# Patient Record
Sex: Male | Born: 1965 | Race: Black or African American | Hispanic: No | Marital: Married | Smoking: Never smoker
Health system: Southern US, Community
[De-identification: ages and names within clinical notes are randomized; demographics above are authoritative.]

## PROBLEM LIST (undated history)

## (undated) DIAGNOSIS — E7800 Pure hypercholesterolemia, unspecified: Secondary | ICD-10-CM

## (undated) DIAGNOSIS — I10 Essential (primary) hypertension: Secondary | ICD-10-CM

---

## 2015-07-08 ENCOUNTER — Encounter (HOSPITAL_COMMUNITY): Payer: Self-pay | Admitting: *Deleted

## 2015-07-08 ENCOUNTER — Emergency Department (HOSPITAL_COMMUNITY): Payer: Federal, State, Local not specified - PPO

## 2015-07-08 ENCOUNTER — Emergency Department (HOSPITAL_COMMUNITY)
Admission: EM | Admit: 2015-07-08 | Discharge: 2015-07-08 | Disposition: A | Discharge status: Home / Self Care | Payer: Federal, State, Local not specified - PPO | Attending: Emergency Medicine | Admitting: Emergency Medicine

## 2015-07-08 DIAGNOSIS — I10 Essential (primary) hypertension: Secondary | ICD-10-CM | POA: Insufficient documentation

## 2015-07-08 DIAGNOSIS — R0781 Pleurodynia: Secondary | ICD-10-CM

## 2015-07-08 DIAGNOSIS — Z79899 Other long term (current) drug therapy: Secondary | ICD-10-CM | POA: Insufficient documentation

## 2015-07-08 DIAGNOSIS — R071 Chest pain on breathing: Secondary | ICD-10-CM | POA: Diagnosis present

## 2015-07-08 HISTORY — DX: Pure hypercholesterolemia, unspecified: E78.00

## 2015-07-08 HISTORY — DX: Essential (primary) hypertension: I10

## 2015-07-08 LAB — BASIC METABOLIC PANEL
ANION GAP: 7 (ref 5–15)
BUN: 23 mg/dL — ABNORMAL HIGH (ref 6–20)
CO2: 28 mmol/L (ref 22–32)
Calcium: 9.4 mg/dL (ref 8.9–10.3)
Chloride: 104 mmol/L (ref 101–111)
Creatinine, Ser: 1.28 mg/dL — ABNORMAL HIGH (ref 0.61–1.24)
GFR calc Af Amer: >60 mL/min (ref >60)
Glucose, Bld: 118 mg/dL — ABNORMAL HIGH (ref 65–99)
POTASSIUM: 3.5 mmol/L (ref 3.5–5.1)
SODIUM: 139 mmol/L (ref 135–145)

## 2015-07-08 LAB — CBC
HEMATOCRIT: 41.7 % (ref 39.0–52.0)
HEMOGLOBIN: 14.2 g/dL (ref 13.0–17.0)
MCH: 28.9 pg (ref 26.0–34.0)
MCHC: 34.1 g/dL (ref 30.0–36.0)
MCV: 84.8 fL (ref 78.0–100.0)
Platelets: 188 10*3/uL (ref 150–400)
RBC: 4.92 MIL/uL (ref 4.22–5.81)
RDW: 13.8 % (ref 11.5–15.5)
WBC: 7.9 10*3/uL (ref 4.0–10.5)

## 2015-07-08 LAB — I-STAT TROPONIN, ED: Troponin i, poc: 0 ng/mL (ref 0.00–0.08)

## 2015-07-08 LAB — D-DIMER, QUANTITATIVE (NOT AT ARMC)

## 2015-07-08 NOTE — ED Notes (Signed)
Pt states that he has had some right sided chest pain x 1 week; pt states that it hurts more when he coughs and states that the pain is relieved when he belches; pt states that the pain radiates to his back; pt states that he has intermittent shortness of breath but denies currently; pt denies N/V; pt denies diaphoresis

## 2015-07-08 NOTE — ED Provider Notes (Addendum)
CSN: [id number]     Arrival date & time 07/08/15  0316 History   First MD Initiated Contact with Patient 07/08/15 0400     Chief Complaint  Patient presents with  . Chest Pain     (Consider location/radiation/quality/duration/timing/severity/associated sxs/prior Treatment) HPI this is a 50 year old male with a history of hypertension on lisinopril but poorly compliant. He is here with a one-week history of chest pain. The chest pain is located in his right upper chest. He describes it as sharp and pleuritic. It was more severe several days ago but is now improving and he describes it now as a "soreness". The pain is worse with deep breathing or cough. He had some shortness of breath earlier in the week which was not severe and has resolved. He denies diaphoresis, nausea, vomiting, diarrhea, fever or chills. He denies leg pain or swelling.  Past Medical History  Diagnosis Date  . Hypertension   . Hypercholesterolemia    History reviewed. No pertinent past surgical history. No family history on file. Social History  Substance Use Topics  . Smoking status: Never Smoker   . Smokeless tobacco: None  . Alcohol Use: No    Review of Systems  All other systems reviewed and are negative.   Allergies  Review of patient's allergies indicates no known allergies.  Home Medications   Prior to Admission medications   Medication Sig Start Date End Date Taking? Authorizing Provider  lisinopril-hydrochlorothiazide (PRINZIDE,ZESTORETIC) 20-25 MG tablet Take 1 tablet by mouth daily. 05/19/15  Yes Historical Provider, MD   BP 164/110 mmHg  Pulse 79  Temp(Src) 98.2 F (36.8 C) (Oral)  Resp 16  Ht 5\' 9"  (1.753 m)  Wt 205 lb (92.987 kg)  BMI 30.26 kg/m2  SpO2 100%   Physical Exam  General: Well-developed, well-nourished male in no acute distress; appearance consistent with age of record HENT: normocephalic; atraumatic Eyes: pupils equal, round and reactive to light; extraocular muscles  intact Neck: supple Heart: regular rate and rhythm; no murmurs, rubs or gallops Lungs: clear to auscultation bilaterally Chest: Nontender Abdomen: soft; nondistended; nontender; no masses or hepatosplenomegaly; bowel sounds present Extremities: No deformity; full range of motion; pulses normal; no edema Neurologic: Awake, alert and oriented; motor function intact in all extremities and symmetric; no facial droop Skin: Warm and dry Psychiatric: Normal mood and affect    ED Course  Procedures (including critical care time)   EKG Interpretation   Date/Time:  Thursday July 08 2015 03:30:43 EDT Ventricular Rate:  69 PR Interval:    QRS Duration: 102 QT Interval:  406 QTC Calculation: 435 R Axis:   36 Text Interpretation:  Sinus rhythm Normal ECG No previous ECGs available  Confirmed by Krystelle Prashad  MD, Jonny RuizJOHN ([id number]) on 07/08/2015 3:41:13 AM      MDM  Nursing notes and vitals signs, including pulse oximetry, reviewed.  Summary of this visit's results, reviewed by myself:  Labs:  Results for orders placed or performed during the hospital encounter of 07/08/15 (from the past 24 hour(s))  Basic metabolic panel     Status: Abnormal   Collection Time: 07/08/15  4:26 AM  Result Value Ref Range   Sodium 139 135 - 145 mmol/L   Potassium 3.5 3.5 - 5.1 mmol/L   Chloride 104 101 - 111 mmol/L   CO2 28 22 - 32 mmol/L   Glucose, Bld 118 (H) 65 - 99 mg/dL   BUN 23 (H) 6 - 20 mg/dL   Creatinine, Ser 1.911.28 (H) 0.61 -  1.24 mg/dL   Calcium 9.4 8.9 - 16.110.3 mg/dL   GFR calc non Af Amer >60 >60 mL/min   GFR calc Af Amer >60 >60 mL/min   Anion gap 7 5 - 15  CBC     Status: None   Collection Time: 07/08/15  4:26 AM  Result Value Ref Range   WBC 7.9 4.0 - 10.5 K/uL   RBC 4.92 4.22 - 5.81 MIL/uL   Hemoglobin 14.2 13.0 - 17.0 g/dL   HCT 09.641.7 04.539.0 - 40.952.0 %   MCV 84.8 78.0 - 100.0 fL   MCH 28.9 26.0 - 34.0 pg   MCHC 34.1 30.0 - 36.0 g/dL   RDW 81.113.8 91.411.5 - 78.215.5 %   Platelets 188 150 - 400 K/uL   D-dimer, quantitative (not at [facility])     Status: None   Collection Time: 07/08/15  4:27 AM  Result Value Ref Range   D-Dimer, Quant <0.27 0.00 - 0.50 ug/mL-FEU  I-stat troponin, ED     Status: None   Collection Time: 07/08/15  4:37 AM  Result Value Ref Range   Troponin i, poc 0.00 0.00 - 0.08 ng/mL   Comment 3            Imaging Studies: Dg Chest 2 View  07/08/2015  CLINICAL DATA:  Midchest pain for 1 week.  Intermittent dyspnea. EXAM: CHEST  2 VIEW COMPARISON:  None. FINDINGS: The heart size and mediastinal contours are within normal limits. Both lungs are clear. The visualized skeletal structures are unremarkable. IMPRESSION: No active cardiopulmonary disease. Electronically Signed   By: Ellery Plunkaniel R Mitchell M.D.   On: 07/08/2015 04:06   5:05 AM The patient was advised of reassuring laboratory and other studies. He was advised to be more compliant with his lisinopril as hypertension can cause serious long-term problems.    Paula LibraJohn Jaryn Hocutt, MD 07/08/15 [id number]  Paula LibraJohn Bostyn Bogie, MD 07/08/15 [id number]

## 2016-09-08 LAB — BASIC METABOLIC PANEL
BUN: 18 (ref 4–21)
CREATININE: 1.2 (ref 0.6–1.3)
Glucose: 131

## 2016-09-08 LAB — HEPATIC FUNCTION PANEL: Bilirubin, Total: 1

## 2017-10-26 IMAGING — CR DG CHEST 2V
2 series · 2 of 2 positions shown · non-contrast
Comparison: None.

CLINICAL DATA: Midchest pain for 1 week.  Intermittent dyspnea.

EXAM:
CHEST  2 VIEW

[w chest pa]
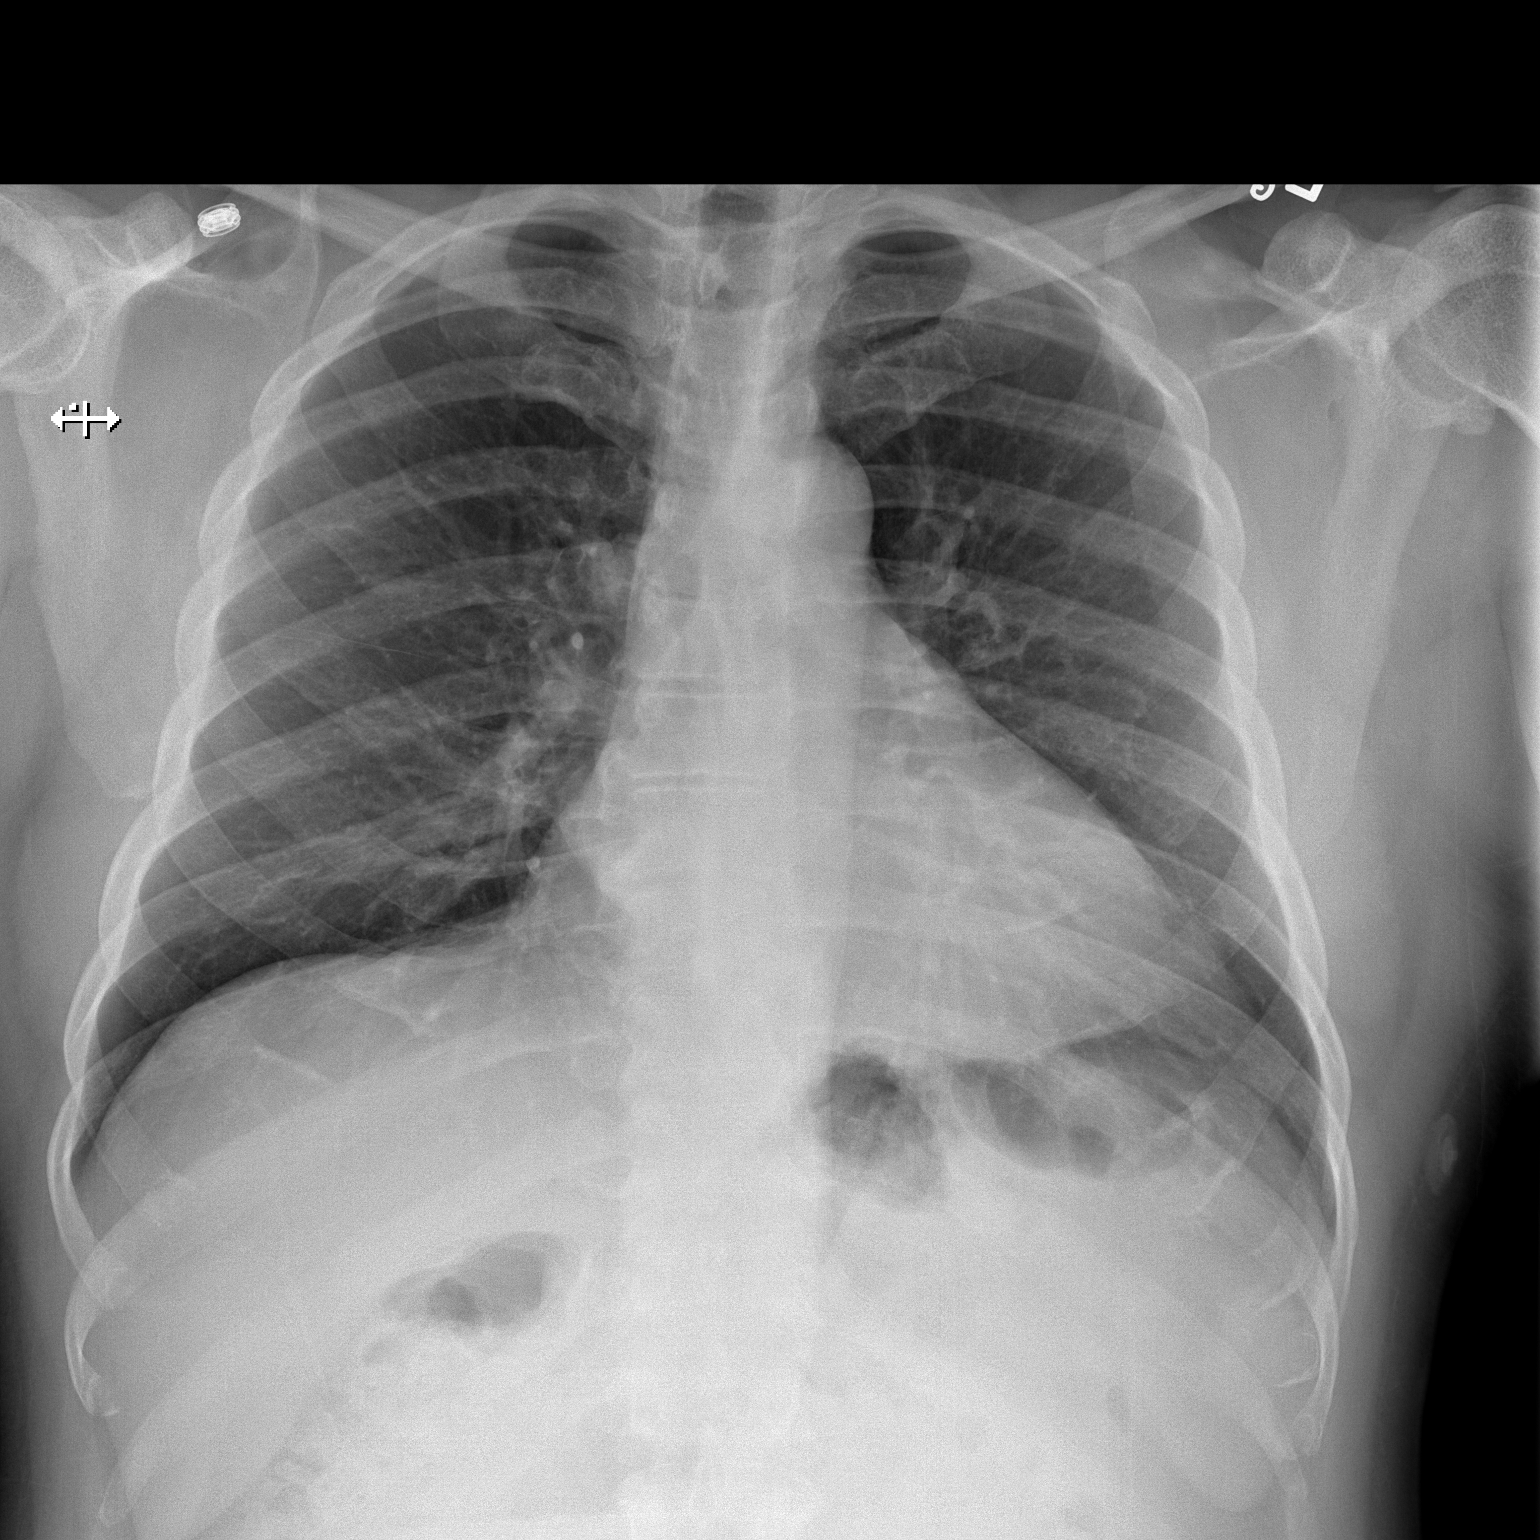

[w chest lat]
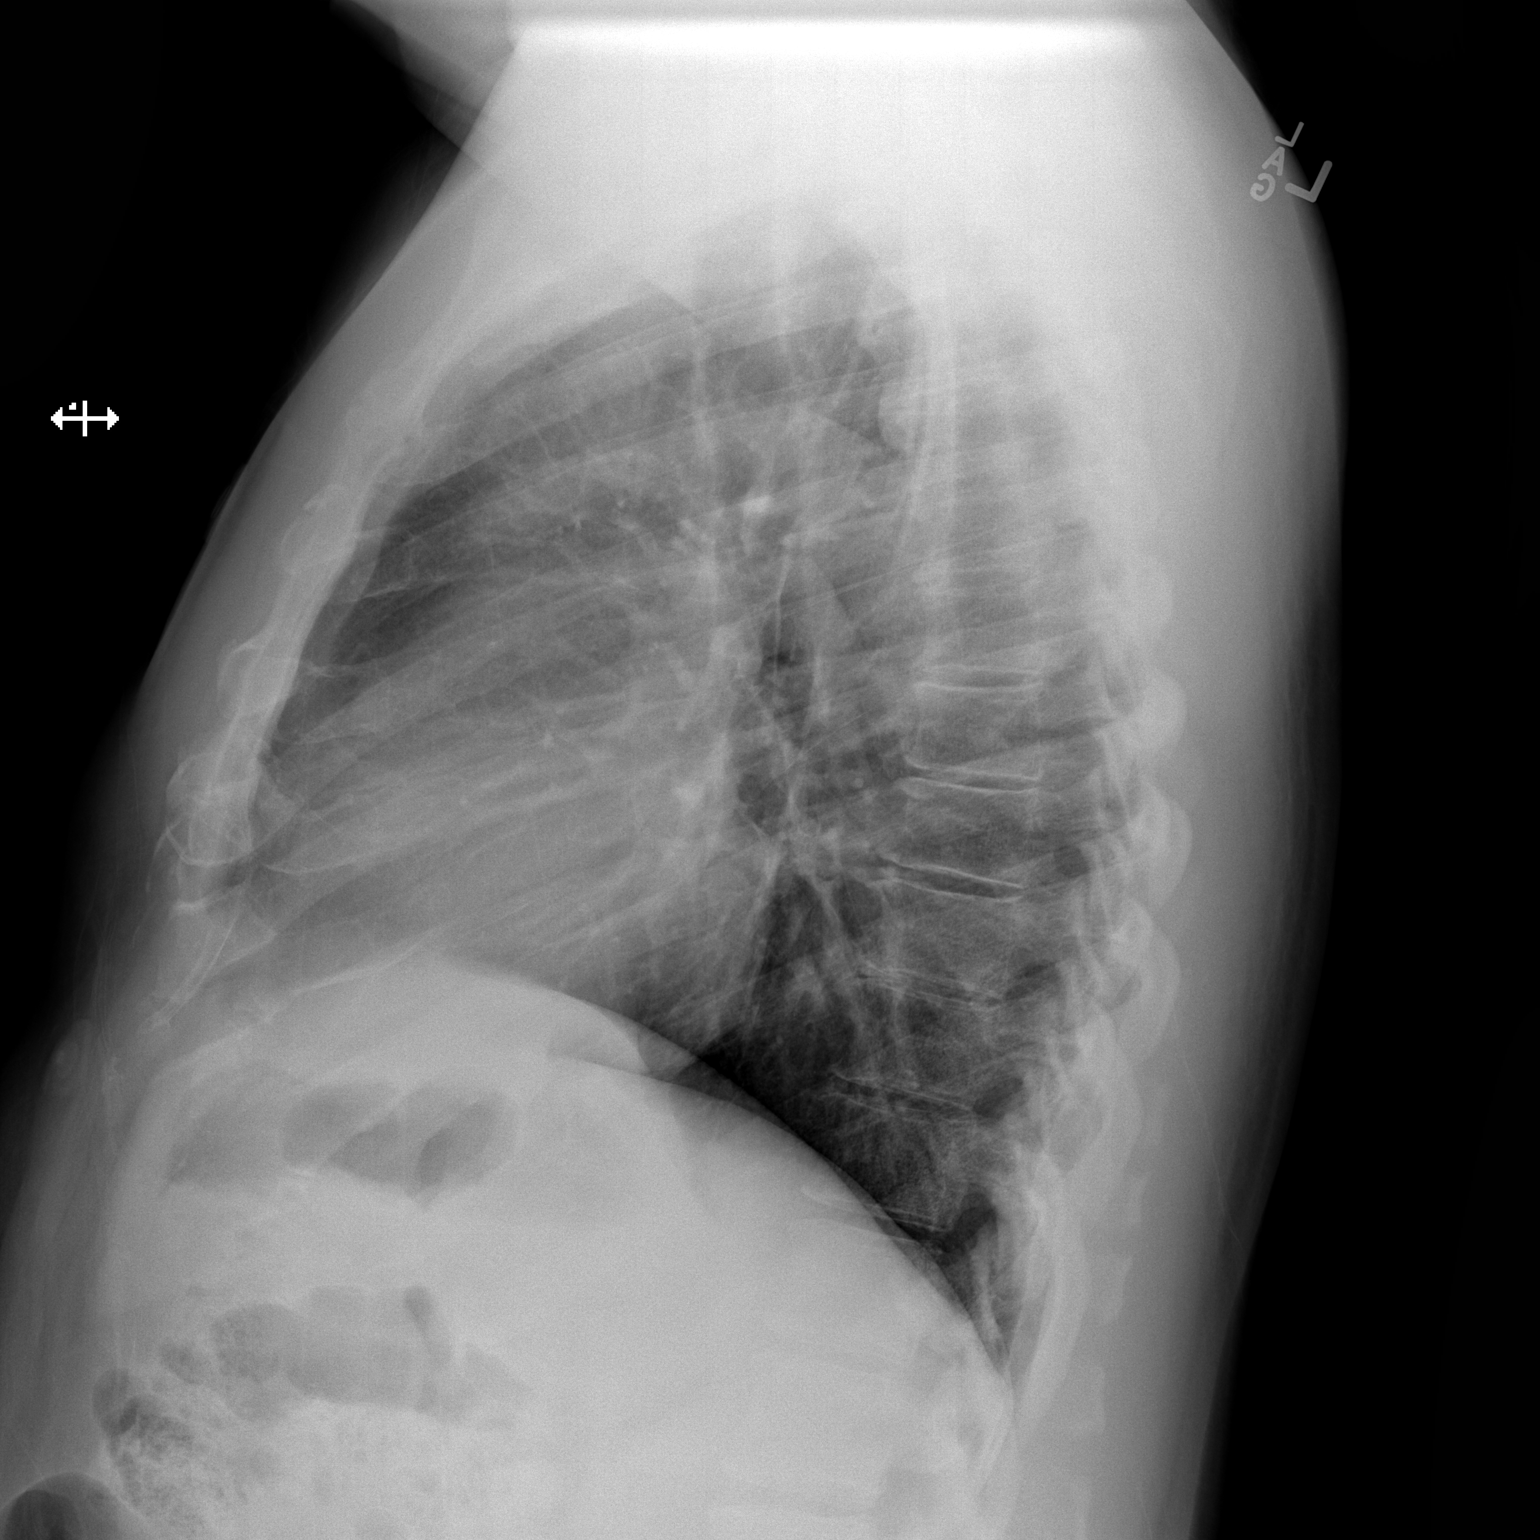

[2 of 2 positions shown; findings below may reference images not displayed]

FINDINGS: The heart size and mediastinal contours are within normal limits.
Both lungs are clear. The visualized skeletal structures are
unremarkable.
IMPRESSION: No active cardiopulmonary disease.

## 2018-03-19 ENCOUNTER — Encounter: Payer: Self-pay | Admitting: Family Medicine

## 2018-03-19 ENCOUNTER — Ambulatory Visit: Payer: Federal, State, Local not specified - PPO | Admitting: Family Medicine

## 2018-03-19 VITALS — BP 134/78 | HR 77 | Temp 98.1°F | Ht 69.0 in | Wt 212.6 lb

## 2018-03-19 DIAGNOSIS — I10 Essential (primary) hypertension: Secondary | ICD-10-CM

## 2018-03-19 DIAGNOSIS — I152 Hypertension secondary to endocrine disorders: Secondary | ICD-10-CM | POA: Insufficient documentation

## 2018-03-19 DIAGNOSIS — Z1211 Encounter for screening for malignant neoplasm of colon: Secondary | ICD-10-CM

## 2018-03-19 DIAGNOSIS — R739 Hyperglycemia, unspecified: Secondary | ICD-10-CM | POA: Diagnosis not present

## 2018-03-19 DIAGNOSIS — E785 Hyperlipidemia, unspecified: Secondary | ICD-10-CM

## 2018-03-19 DIAGNOSIS — Z0001 Encounter for general adult medical examination with abnormal findings: Secondary | ICD-10-CM

## 2018-03-19 DIAGNOSIS — E1159 Type 2 diabetes mellitus with other circulatory complications: Secondary | ICD-10-CM | POA: Insufficient documentation

## 2018-03-19 DIAGNOSIS — E1169 Type 2 diabetes mellitus with other specified complication: Secondary | ICD-10-CM | POA: Insufficient documentation

## 2018-03-19 DIAGNOSIS — E1165 Type 2 diabetes mellitus with hyperglycemia: Secondary | ICD-10-CM | POA: Insufficient documentation

## 2018-03-19 DIAGNOSIS — Z6831 Body mass index (BMI) 31.0-31.9, adult: Secondary | ICD-10-CM

## 2018-03-19 LAB — COMPREHENSIVE METABOLIC PANEL
ALT: 21 U/L (ref 0–53)
AST: 22 U/L (ref 0–37)
Albumin: 4.7 g/dL (ref 3.5–5.2)
Alkaline Phosphatase: 65 U/L (ref 39–117)
BUN: 18 mg/dL (ref 6–23)
CHLORIDE: 102 meq/L (ref 96–112)
CO2: 28 meq/L (ref 19–32)
CREATININE: 1.13 mg/dL (ref 0.40–1.50)
Calcium: 9.9 mg/dL (ref 8.4–10.5)
GFR: 82.65 mL/min (ref >60.00)
Glucose, Bld: 116 mg/dL — ABNORMAL HIGH (ref 70–99)
Potassium: 4.2 mEq/L (ref 3.5–5.1)
SODIUM: 138 meq/L (ref 135–145)
Total Bilirubin: 0.7 mg/dL (ref 0.2–1.2)
Total Protein: 7.3 g/dL (ref 6.0–8.3)

## 2018-03-19 LAB — LIPID PANEL
CHOLESTEROL: 192 mg/dL (ref 0–200)
HDL: 38.1 mg/dL — AB (ref >39.00)
LDL Cholesterol: 129 mg/dL — ABNORMAL HIGH (ref 0–99)
NonHDL: 153.58
Total CHOL/HDL Ratio: 5
Triglycerides: 125 mg/dL (ref 0.0–149.0)
VLDL: 25 mg/dL (ref 0.0–40.0)

## 2018-03-19 LAB — CBC
HCT: 45 % (ref 39.0–52.0)
Hemoglobin: 15.3 g/dL (ref 13.0–17.0)
MCHC: 33.9 g/dL (ref 30.0–36.0)
MCV: 85.5 fl (ref 78.0–100.0)
Platelets: 228 10*3/uL (ref 150.0–400.0)
RBC: 5.26 Mil/uL (ref 4.22–5.81)
RDW: 14.1 % (ref 11.5–15.5)
WBC: 6.4 10*3/uL (ref 4.0–10.5)

## 2018-03-19 LAB — TSH: TSH: 0.74 u[IU]/mL (ref 0.35–4.50)

## 2018-03-19 LAB — HEMOGLOBIN A1C: Hgb A1c MFr Bld: 7.1 % — ABNORMAL HIGH (ref 4.6–6.5)

## 2018-03-19 NOTE — Assessment & Plan Note (Signed)
Check lipid panel.  Continue lifestyle modifications. 

## 2018-03-19 NOTE — Assessment & Plan Note (Signed)
At goal today off medications.  Congratulated patient on recent lifestyle adjustments.  Given that his blood pressure has been well controlled over the last couple of weeks without medications, I think it is reasonable to continue his current course without medications.  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  He will continue monitoring his blood pressure next couple weeks and he will let me know if persistently 140/90 or higher.  Check CBC, C met, and TSH today. 

## 2018-03-19 NOTE — Assessment & Plan Note (Signed)
Check A1c. 

## 2018-03-19 NOTE — Patient Instructions (Signed)
It was very nice to see you today!  Keep up the good work!  You do not need a blood pressure medication at this point.  We will check blood work today.  Keep an eye on your blood pressure and let me know if persistently 140/90 or higher over the next couple of weeks.  We will set you up to have your colon cancer screening done.   Eat at least 3 REAL meals and 1-2 snacks per day.  Aim for no more than 5 hours between eating.  Eat breakfast within one hour of getting up.    Obtain twice as many fruits/vegetables as protein or carbohydrate foods for both lunch and dinner.   Cut down on sweet beverages. This includes juice, soda, and sweet tea.    Exercise at least 150 minutes every week.   Come back in 1 year for your next physical, or sooner as needed.  Take care, Dr Jerline Pain   [facility] Eating Plan DASH stands for "Dietary Approaches to Stop Hypertension." The DASH eating plan is a healthy eating plan that has been shown to reduce high blood pressure (hypertension). It may also reduce your risk for type 2 diabetes, heart disease, and stroke. The DASH eating plan may also help with weight loss. What are tips for following this plan?  General guidelines  Avoid eating more than 2,300 mg (milligrams) of salt (sodium) a day. If you have hypertension, you may need to reduce your sodium intake to 1,500 mg a day.  Limit alcohol intake to no more than 1 drink a day for nonpregnant women and 2 drinks a day for men. One drink equals 12 oz of beer, 5 oz of wine, or 1 oz of hard liquor.  Work with your health care provider to maintain a healthy body weight or to lose weight. Ask what an ideal weight is for you.  Get at least 30 minutes of exercise that causes your heart to beat faster (aerobic exercise) most days of the week. Activities may include walking, swimming, or biking.  Work with your health care provider or diet and nutrition specialist (dietitian) to adjust your eating plan to your  individual calorie needs. Reading food labels   Check food labels for the amount of sodium per serving. Choose foods with less than 5 percent of the Daily Value of sodium. Generally, foods with less than 300 mg of sodium per serving fit into this eating plan.  To find whole grains, look for the word "whole" as the first word in the ingredient list. Shopping  Buy products labeled as "low-sodium" or "no salt added."  Buy fresh foods. Avoid canned foods and premade or frozen meals. Cooking  Avoid adding salt when cooking. Use salt-free seasonings or herbs instead of table salt or sea salt. Check with your health care provider or pharmacist before using salt substitutes.  Do not fry foods. Cook foods using healthy methods such as baking, boiling, grilling, and broiling instead.  Cook with heart-healthy oils, such as olive, canola, soybean, or sunflower oil. Meal planning  Eat a balanced diet that includes: ? 5 or more servings of fruits and vegetables each day. At each meal, try to fill half of your plate with fruits and vegetables. ? Up to 6-8 servings of whole grains each day. ? Less than 6 oz of lean meat, poultry, or fish each day. A 3-oz serving of meat is about the same size as a deck of cards. One egg equals 1 oz. ?  2 servings of low-fat dairy each day. ? A serving of nuts, seeds, or beans 5 times each week. ? Heart-healthy fats. Healthy fats called Omega-3 fatty acids are found in foods such as flaxseeds and coldwater fish, like sardines, salmon, and mackerel.  Limit how much you eat of the following: ? Canned or prepackaged foods. ? Food that is high in trans fat, such as fried foods. ? Food that is high in saturated fat, such as fatty meat. ? Sweets, desserts, sugary drinks, and other foods with added sugar. ? Full-fat dairy products.  Do not salt foods before eating.  Try to eat at least 2 vegetarian meals each week.  Eat more home-cooked food and less restaurant,  buffet, and fast food.  When eating at a restaurant, ask that your food be prepared with less salt or no salt, if possible. What foods are recommended? The items listed may not be a complete list. Talk with your dietitian about what dietary choices are best for you. Grains Whole-grain or whole-wheat bread. Whole-grain or whole-wheat pasta. Brown rice. [place]. Bulgur. Whole-grain and low-sodium cereals. Pita bread. Low-fat, low-sodium crackers. Whole-wheat flour tortillas. Vegetables Fresh or frozen vegetables (raw, steamed, roasted, or grilled). Low-sodium or reduced-sodium tomato and vegetable juice. Low-sodium or reduced-sodium tomato sauce and tomato paste. Low-sodium or reduced-sodium canned vegetables. Fruits All fresh, dried, or frozen fruit. Canned fruit in natural juice (without added sugar). Meat and other protein foods Skinless chicken or [place]. Ground chicken or [place]. Pork with fat trimmed off. Fish and seafood. Egg whites. Dried beans, peas, or lentils. Unsalted nuts, nut butters, and seeds. Unsalted canned beans. Lean cuts of beef with fat trimmed off. Low-sodium, lean deli meat. Dairy Low-fat (1%) or fat-free (skim) milk. Fat-free, low-fat, or reduced-fat cheeses. Nonfat, low-sodium ricotta or cottage cheese. Low-fat or nonfat yogurt. Low-fat, low-sodium cheese. Fats and oils Soft margarine without trans fats. Vegetable oil. Low-fat, reduced-fat, or light mayonnaise and salad dressings (reduced-sodium). Canola, safflower, olive, soybean, and sunflower oils. Avocado. Seasoning and other foods Herbs. Spices. Seasoning mixes without salt. Unsalted popcorn and pretzels. Fat-free sweets. What foods are not recommended? The items listed may not be a complete list. Talk with your dietitian about what dietary choices are best for you. Grains Baked goods made with fat, such as croissants, muffins, or some breads. Dry pasta or rice meal packs. Vegetables Creamed or fried  vegetables. Vegetables in a cheese sauce. Regular canned vegetables (not low-sodium or reduced-sodium). Regular canned tomato sauce and paste (not low-sodium or reduced-sodium). Regular tomato and vegetable juice (not low-sodium or reduced-sodium). Angie Fava. Olives. Fruits Canned fruit in a light or heavy syrup. Fried fruit. Fruit in cream or butter sauce. Meat and other protein foods Fatty cuts of meat. Ribs. Fried meat. Berniece Salines. Sausage. Bologna and other processed lunch meats. Salami. Fatback. Hotdogs. Bratwurst. Salted nuts and seeds. Canned beans with added salt. Canned or smoked fish. Whole eggs or egg yolks. Chicken or [place] with skin. Dairy Whole or 2% milk, cream, and half-and-half. Whole or full-fat cream cheese. Whole-fat or sweetened yogurt. Full-fat cheese. Nondairy creamers. Whipped toppings. Processed cheese and cheese spreads. Fats and oils Butter. Stick margarine. Lard. Shortening. Ghee. Bacon fat. Tropical oils, such as coconut, palm kernel, or palm oil. Seasoning and other foods Salted popcorn and pretzels. Onion salt, garlic salt, seasoned salt, table salt, and sea salt. Worcestershire sauce. Tartar sauce. Barbecue sauce. Teriyaki sauce. Soy sauce, including reduced-sodium. Steak sauce. Canned and packaged gravies. Fish sauce. Oyster sauce. Cocktail sauce.  Horseradish that you find on the shelf. Ketchup. Mustard. Meat flavorings and tenderizers. Bouillon cubes. Hot sauce and Tabasco sauce. Premade or packaged marinades. Premade or packaged taco seasonings. Relishes. Regular salad dressings. Where to find more information:  National Heart, Lung, and [place]: https://wilson-eaton.com/  American Heart Association: www.heart.org Summary  The DASH eating plan is a healthy eating plan that has been shown to reduce high blood pressure (hypertension). It may also reduce your risk for type 2 diabetes, heart disease, and stroke.  With the DASH eating plan, you should limit salt (sodium)  intake to 2,300 mg a day. If you have hypertension, you may need to reduce your sodium intake to 1,500 mg a day.  When on the DASH eating plan, aim to eat more fresh fruits and vegetables, whole grains, lean proteins, low-fat dairy, and heart-healthy fats.  Work with your health care provider or diet and nutrition specialist (dietitian) to adjust your eating plan to your individual calorie needs. This information is not intended to replace advice given to you by your health care provider. Make sure you discuss any questions you have with your health care provider. Document Released: 12/15/2010 Document Revised: 12/20/2015 Document Reviewed: 12/20/2015 Elsevier Interactive Patient Education  2019 [place] Years, Male Preventive care refers to lifestyle choices and visits with your health care provider that can promote health and wellness. What does preventive care include?   A yearly physical exam. This is also called an annual well check.  Dental exams once or twice a year.  Routine eye exams. Ask your health care provider how often you should have your eyes checked.  Personal lifestyle choices, including: ? Daily care of your teeth and gums. ? Regular physical activity. ? Eating a healthy diet. ? Avoiding tobacco and drug use. ? Limiting alcohol use. ? Practicing safe sex. ? Taking low-dose aspirin every day starting at age 60. What happens during an annual well check? The services and screenings done by your health care provider during your annual well check will depend on your age, overall health, lifestyle risk factors, and family history of disease. Counseling Your health care provider may ask you questions about your:  Alcohol use.  Tobacco use.  Drug use.  Emotional well-being.  Home and relationship well-being.  Sexual activity.  Eating habits.  Work and work Statistician. Screening You may have the following tests or  measurements:  Height, weight, and BMI.  Blood pressure.  Lipid and cholesterol levels. These may be checked every 5 years, or more frequently if you are over 33 years old.  Skin check.  Lung cancer screening. You may have this screening every year starting at age 68 if you have a 30-pack-year history of smoking and currently smoke or have quit within the past 15 years.  Colorectal cancer screening. All adults should have this screening starting at age 43 and continuing until age 44. Your health care provider may recommend screening at age 73. You will have tests every 1-10 years, depending on your results and the type of screening test. People at increased risk should start screening at an earlier age. Screening tests may include: ? Guaiac-based fecal occult blood testing. ? Fecal immunochemical test (FIT). ? Stool DNA test. ? Virtual colonoscopy. ? Sigmoidoscopy. During this test, a flexible tube with a tiny camera (sigmoidoscope) is used to examine your rectum and lower colon. The sigmoidoscope is inserted through your anus into your rectum and lower colon. ? Colonoscopy. During  this test, a long, thin, flexible tube with a tiny camera (colonoscope) is used to examine your entire colon and rectum.  Prostate cancer screening. Recommendations will vary depending on your family history and other risks.  Hepatitis C blood test.  Hepatitis B blood test.  Sexually transmitted disease (STD) testing.  Diabetes screening. This is done by checking your blood sugar (glucose) after you have not eaten for a while (fasting). You may have this done every 1-3 years. Discuss your test results, treatment options, and if necessary, the need for more tests with your health care provider. Vaccines Your health care provider may recommend certain vaccines, such as:  Influenza vaccine. This is recommended every year.  Tetanus, diphtheria, and acellular pertussis (Tdap, Td) vaccine. You may need a Td  booster every 10 years.  Varicella vaccine. You may need this if you have not been vaccinated.  Zoster vaccine. You may need this after age 35.  Measles, mumps, and rubella (MMR) vaccine. You may need at least one dose of MMR if you were born in 1957 or later. You may also need a second dose.  Pneumococcal 13-valent conjugate (PCV13) vaccine. You may need this if you have certain conditions and have not been vaccinated.  Pneumococcal polysaccharide (PPSV23) vaccine. You may need one or two doses if you smoke cigarettes or if you have certain conditions.  Meningococcal vaccine. You may need this if you have certain conditions.  Hepatitis A vaccine. You may need this if you have certain conditions or if you travel or work in places where you may be exposed to hepatitis A.  Hepatitis B vaccine. You may need this if you have certain conditions or if you travel or work in places where you may be exposed to hepatitis B.  Haemophilus influenzae type b (Hib) vaccine. You may need this if you have certain risk factors. Talk to your health care provider about which screenings and vaccines you need and how often you need them. This information is not intended to replace advice given to you by your health care provider. Make sure you discuss any questions you have with your health care provider. Document Released: 01/22/2015 Document Revised: 02/15/2017 Document Reviewed: 10/27/2014 Elsevier Interactive Patient Education  2019 [employer].

## 2018-03-19 NOTE — Progress Notes (Signed)
Chief Complaint:  Clinton Church is a 53 y.o. male who presents today for his annual comprehensive physical exam.    Assessment/Plan:  Hyperglycemia Check A1c.  Essential hypertension At goal today off medications.  Congratulated patient on recent lifestyle adjustments.  Given that his blood pressure has been well controlled over the last couple of weeks without medications, I think it is reasonable to continue his current course without medications.  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  He will continue monitoring his blood pressure next couple weeks and he will let me know if persistently 140/90 or higher.  Check CBC, C met, and TSH today.  Dyslipidemia Check lipid panel.  Continue lifestyle modifications.  BMI 31 Discussed lifestyle modifications.  Preventative Healthcare: Place referral for colon cancer screening.  Patient Counseling(The following topics were reviewed and/or handout was given):  -Nutrition: Stressed importance of moderation in sodium/caffeine intake, saturated fat and cholesterol, caloric balance, sufficient intake of fresh fruits, vegetables, and fiber.  -Stressed the importance of regular exercise.   -Substance Abuse: Discussed cessation/primary prevention of tobacco, alcohol, or other drug use; driving or other dangerous activities under the influence; availability of treatment for abuse.   -Injury prevention: Discussed safety belts, safety helmets, smoke detector, smoking near bedding or upholstery.   -Sexuality: Discussed sexually transmitted diseases, partner selection, use of condoms, avoidance of unintended pregnancy and contraceptive alternatives.   -Dental health: Discussed importance of regular tooth brushing, flossing, and dental visits.  -Health maintenance and immunizations reviewed. Please refer to Health maintenance section.  Return to care in 1 year for next preventative visit.       Subjective:  HPI:  He has no acute complaints today.   He has a few chronic conditions outlined below:  # Hypertension Several year history.  New problem to provider.  Has been on blood pressure medications in the past and is most recently prescribed lisinopril-HCTZ.  At his most recent [employer] physical was found to have elevated blood pressure readings into the 170s over 100s.  He has made significant lifestyle changes since then.  Has cut out a lot of junk food and has been more active.  He has not been on any medication for the past couple of weeks.  No reported chest pain or shortness of breath.  #Dyslipidemia Has also been told he has elevated cholesterol readings in the past.  Does not remember his most recent numbers.  Is never been on cholesterol medication in the past.  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  He has never been on medications for diabetes or blood sugar.  Lifestyle Diet: Trying to eat a healthy, balanced diet.  Exercise: Likes to walk and run a few times per week.   Depression screen PHQ 2/9 03/19/2018  Decreased Interest 0  Down, Depressed, Hopeless 0  PHQ - 2 Score 0    Health Maintenance Due  Topic Date Due  . HIV Screening  10/18/1981  . COLONOSCOPY  10/18/2016     ROS: Per HPI, otherwise a complete review of systems was negative.   PMH:  The following were reviewed and entered/updated in epic: Past Medical History:  Diagnosis Date  . Hypercholesterolemia   . Hypertension    Patient Active Problem List   Diagnosis Date Noted  . Hyperglycemia 03/19/2018  .  Essential hypertension 03/19/2018  . Dyslipidemia 03/19/2018   History reviewed. No pertinent surgical history.  Family History  Problem Relation Age of Onset  . Diabetes Mother   . Lung cancer Father   . Prostate cancer Neg Hx   . Colon cancer  Neg Hx     Medications- reviewed and updated No current outpatient medications on file.   No current facility-administered medications for this visit.     Allergies-reviewed and updated No Known Allergies  Social History   Socioeconomic History  . Marital status: Married    Spouse name: Not on file  . Number of children: Not on file  . Years of education: Not on file  . Highest education level: Not on file  Occupational History  . Not on file  Social Needs  . Financial resource strain: Not on file  . Food insecurity:    Worry: Not on file    Inability: Not on file  . Transportation needs:    Medical: Not on file    Non-medical: Not on file  Tobacco Use  . Smoking status: Never Smoker  . Smokeless tobacco: Never Used  Substance and Sexual Activity  . Alcohol use: No  . Drug use: No  . Sexual activity: Not on file  Lifestyle  . Physical activity:    Days per week: Not on file    Minutes per session: Not on file  . Stress: Not on file  Relationships  . Social connections:    Talks on phone: Not on file    Gets together: Not on file    Attends religious service: Not on file    Active member of club or organization: Not on file    Attends meetings of clubs or organizations: Not on file    Relationship status: Not on file  Other Topics Concern  . Not on file  Social History Narrative  . Not on file        Objective:  Physical Exam: BP 134/78 (BP Location: Left Arm, Patient Position: Sitting, Cuff Size: Normal)   Pulse 77   Temp 98.1 F (36.7 C) (Oral)   Ht 5' 9"  (1.753 m)   Wt 212 lb 9.6 oz (96.4 kg)   SpO2 96%   BMI 31.40 kg/m   Body mass index is 31.4 kg/m. Wt Readings from Last 3 Encounters:  03/19/18 212 lb 9.6 oz (96.4 kg)  07/08/15 205 lb (93 kg)   Gen: NAD, resting comfortably HEENT: TMs normal bilaterally. OP clear. No thyromegaly noted.  CV: RRR with no murmurs appreciated Pulm: NWOB, CTAB with no crackles, wheezes, or rhonchi GI:  Normal bowel sounds present. Soft, Nontender, Nondistended. MSK: no edema, cyanosis, or clubbing noted Skin: warm, dry Neuro: CN2-12 grossly intact. Strength 5/5 in upper and lower extremities. Reflexes symmetric and intact bilaterally.  Psych: Normal affect and thought content     Caleb M. Jerline Pain, MD 03/19/2018 12:27 PM

## 2018-03-21 NOTE — Progress Notes (Signed)
Please inform patient of the following:  His blood sugar is elevated. His A1c is 7.1 - ideally should be in the mid 6s or lower. Would like for him to start metformin xr 750mg  daily. We should recheck his A1c in 3-6 months.   His cholesterol is also elevated. He would benefit from starting lipitor 40mg  daily to improve his numbers and reduce his risk of heart attack and stroke. We can recheck in 12 months.  All of his other blood work is normal.   Katina Degree. [name], MD 03/21/2018 8:41 PM

## 2018-03-22 ENCOUNTER — Other Ambulatory Visit: Payer: Self-pay

## 2018-03-22 MED ORDER — ATORVASTATIN CALCIUM 40 MG PO TABS: 40.0000 mg | ORAL_TABLET | Freq: Every day | ORAL | 3 refills | Status: DC

## 2018-03-22 MED ORDER — METFORMIN HCL ER 750 MG PO TB24: 750.0000 mg | ORAL_TABLET | Freq: Every day | ORAL | 1 refills | Status: DC

## 2018-04-02 ENCOUNTER — Encounter: Payer: Self-pay | Admitting: Family Medicine

## 2018-07-24 ENCOUNTER — Ambulatory Visit: Payer: Federal, State, Local not specified - PPO | Admitting: Family Medicine

## 2018-07-25 ENCOUNTER — Ambulatory Visit: Payer: Federal, State, Local not specified - PPO | Admitting: Family Medicine

## 2018-07-30 ENCOUNTER — Encounter: Payer: Self-pay | Admitting: Family Medicine

## 2018-08-23 ENCOUNTER — Encounter: Payer: Self-pay | Admitting: Family Medicine

## 2018-08-23 ENCOUNTER — Ambulatory Visit (INDEPENDENT_AMBULATORY_CARE_PROVIDER_SITE_OTHER): Payer: Federal, State, Local not specified - PPO | Admitting: Family Medicine

## 2018-08-23 ENCOUNTER — Other Ambulatory Visit: Payer: Self-pay

## 2018-08-23 VITALS — BP 168/100 | HR 68 | Temp 98.5°F | Ht 69.0 in | Wt 216.4 lb

## 2018-08-23 DIAGNOSIS — R739 Hyperglycemia, unspecified: Secondary | ICD-10-CM | POA: Diagnosis not present

## 2018-08-23 DIAGNOSIS — I10 Essential (primary) hypertension: Secondary | ICD-10-CM

## 2018-08-23 DIAGNOSIS — N529 Male erectile dysfunction, unspecified: Secondary | ICD-10-CM | POA: Diagnosis not present

## 2018-08-23 DIAGNOSIS — F439 Reaction to severe stress, unspecified: Secondary | ICD-10-CM

## 2018-08-23 DIAGNOSIS — E785 Hyperlipidemia, unspecified: Secondary | ICD-10-CM | POA: Diagnosis not present

## 2018-08-23 LAB — POCT GLYCOSYLATED HEMOGLOBIN (HGB A1C): Hemoglobin A1C: 6.8 % — AB (ref 4.0–5.6)

## 2018-08-23 MED ORDER — TADALAFIL 10 MG PO TABS: 10.0000 mg | ORAL_TABLET | Freq: Every day | ORAL | 0 refills | Status: DC | PRN

## 2018-08-23 MED ORDER — AMLODIPINE BESYLATE 5 MG PO TABS: 5.0000 mg | ORAL_TABLET | Freq: Every day | ORAL | 3 refills | Status: DC

## 2018-08-23 MED ORDER — METFORMIN HCL 500 MG PO TABS: 500.0000 mg | ORAL_TABLET | Freq: Every day | ORAL | 3 refills | Status: DC

## 2018-08-23 NOTE — Assessment & Plan Note (Signed)
Start Cialis 10 mg daily as needed.  Discussed potential side effects. 

## 2018-08-23 NOTE — Assessment & Plan Note (Signed)
A1c improved to 6.8.  Will send in metformin 500 mg daily due to recall.  Follow-up in 3 to 6 months.

## 2018-08-23 NOTE — Patient Instructions (Signed)
It was very nice to see you today!  Try the cialis.  We will start amlodipine and a lower dose metformin.  Keep an eye on your BP and let me know if persistently 140/90 or higher.  Come back to see me in 6 months, or sooner if needed.   Take care, Dr Jerline Pain  Please try these tips to maintain a healthy lifestyle:   Eat at least 3 REAL meals and 1-2 snacks per day.  Aim for no more than 5 hours between eating.  If you eat breakfast, please do so within one hour of getting up.    Obtain twice as many fruits/vegetables as protein or carbohydrate foods for both lunch and dinner. (Half of each meal should be fruits/vegetables, one quarter protein, and one quarter starchy carbs)   Cut down on sweet beverages. This includes juice, soda, and sweet tea.    Exercise at least 150 minutes every week.

## 2018-08-23 NOTE — Assessment & Plan Note (Signed)
Doing well on Lipitor 40 mg daily.  Check lipid panel next blood draw.

## 2018-08-23 NOTE — Assessment & Plan Note (Signed)
Above goal.  Start Norvasc 5 mg daily.  Discussed home blood pressure monitoring with goal 140/90 or lower.

## 2018-08-23 NOTE — Progress Notes (Signed)
   Chief Complaint:  Clinton Church is a 53 y.o. male who presents today with a chief complaint of hyperglycemia follow up.   Assessment/Plan:  Erectile dysfunction Start Cialis 10 mg daily as needed.  Discussed potential side effects.  Dyslipidemia Doing well on Lipitor 40 mg daily.  Check lipid panel next blood draw.  Essential hypertension Above goal.  Start Norvasc 5 mg daily.  Discussed home blood pressure monitoring with goal 140/90 or lower.  Hyperglycemia A1c improved to 6.8.  Will send in metformin 500 mg daily due to recall.  Follow-up in 3 to 6 months.  Stress Discussed risk reduction techniques.  Encourage patient to reduce workload to focus on physical health.  He is agreeable to this.  Follow-up in 3 to 6 months.    Subjective:  HPI:  Patient has been under increased rest recently.  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  He has thought about cutting down on house hours at work.  Additionally he commutes about 2 hours every day to and from work which also contributes to his stress.  Does not feel that he has enough downtime to rest and recover.  He has not been able to exercise as much as he would like to recently due to his work schedule.  He is also been trying to eat healthier however this is been difficult as his wife has been continue to make food choices that he is not agreeable with.  Patient has had some erectile dysfunction issues for the past several months as well.  Is never had anything like this in the past.  He has difficulty getting and maintaining erections. No reported discharge or hematuria  His stable, chronic medical conditions are outlined below:  # Hyperglycemia - Was on metformin but stopped due to the recall  # Dyslipidemia - On lipitor 40mg  daily and tolerating well - ROS: No reported myalgias  # Essential Hypertension - Not currently on any medications.   ROS: Per HPI  PMH: He reports that he has  never smoked. He has never used smokeless tobacco. He reports that he does not drink alcohol or use drugs.      Objective:  Physical Exam: BP (!) 168/100   Pulse 68   Temp 98.5 F (36.9 C)   Ht 5\' 9"  (1.753 m)   Wt 216 lb 6.1 oz (98.1 kg)   SpO2 98%   BMI 31.95 kg/m   Wt Readings from Last 3 Encounters:  08/23/18 216 lb 6.1 oz (98.1 kg)  03/19/18 212 lb 9.6 oz (96.4 kg)  07/08/15 205 lb (93 kg)  Gen: NAD, resting comfortably CV: Regular rate and rhythm with no murmurs appreciated Pulm: Normal work of breathing, clear to auscultation bilaterally with no crackles, wheezes, or rhonchi MSK: No edema, cyanosis, or clubbing noted Skin: Warm, dry Neuro: Grossly normal, moves all extremities Psych: Normal affect and thought content      Ezzie Senat M. Jerline Pain, MD 08/23/2018 11:50 AM

## 2018-10-16 ENCOUNTER — Other Ambulatory Visit: Payer: Self-pay | Admitting: Family Medicine

## 2018-12-30 ENCOUNTER — Encounter: Payer: Self-pay | Admitting: Family Medicine

## 2018-12-31 ENCOUNTER — Other Ambulatory Visit: Payer: Self-pay

## 2019-01-01 ENCOUNTER — Ambulatory Visit (INDEPENDENT_AMBULATORY_CARE_PROVIDER_SITE_OTHER): Payer: Federal, State, Local not specified - PPO | Admitting: Family Medicine

## 2019-01-01 ENCOUNTER — Encounter: Payer: Self-pay | Admitting: Family Medicine

## 2019-01-01 VITALS — BP 150/93 | HR 81 | Temp 98.3°F | Ht 69.0 in | Wt 194.2 lb

## 2019-01-01 DIAGNOSIS — R739 Hyperglycemia, unspecified: Secondary | ICD-10-CM

## 2019-01-01 DIAGNOSIS — I10 Essential (primary) hypertension: Secondary | ICD-10-CM | POA: Diagnosis not present

## 2019-01-01 DIAGNOSIS — R3915 Urgency of urination: Secondary | ICD-10-CM

## 2019-01-01 DIAGNOSIS — R319 Hematuria, unspecified: Secondary | ICD-10-CM

## 2019-01-01 LAB — POC URINALSYSI DIPSTICK (AUTOMATED)
Bilirubin, UA: NEGATIVE
Blood, UA: POSITIVE
Glucose, UA: POSITIVE — AB
Ketones, UA: POSITIVE
Leukocytes, UA: NEGATIVE
Nitrite, UA: NEGATIVE
Protein, UA: NEGATIVE
Spec Grav, UA: 1.025 (ref 1.010–1.025)
Urobilinogen, UA: 0.2 E.U./dL
pH, UA: 5.5 (ref 5.0–8.0)

## 2019-01-01 LAB — GLUCOSE, POCT (MANUAL RESULT ENTRY): POC Glucose: 295 mg/dl — AB (ref 70–99)

## 2019-01-01 MED ORDER — EMPAGLIFLOZIN-METFORMIN HCL 12.5-1000 MG PO TABS: 1.0000 | ORAL_TABLET | Freq: Two times a day (BID) | ORAL | 5 refills | Status: DC

## 2019-01-01 NOTE — Progress Notes (Signed)
   Chief Complaint:  Clinton Church is a 53 y.o. male who presents today with a chief complaint of frequent urination.   Assessment/Plan:  Hyperglycemia Fasting glucose this morning 296.  A1c nondetectable.  Vital signs stable.  No signs of DKA.  Discussed diabetic diet and lifestyle modifications.  Will start empagliflozin-Metformin 12.05-998 twice daily.  He will follow-up in a few weeks.  Essential hypertension Above goal.  Has not taken blood pressure medicine a couple of days.  Will start Norvasc 5 mg daily.  He will follow-up in a few weeks.  Continue home blood pressure monitoring goal 140/90 or lower.  Frequent urination/hematuria UA with blood and glucose today.  He has uncontrolled diabetes-we will treat as above.  Unclear etiology for hematuria.  We will plan on rechecking UA when he comes back in a few weeks.    Subjective:  HPI:  Frequent Urination  Start about a month ago.  Associated with frequent thirst as well.  Has had about a 20 pound weight loss over the last month.  No dysuria.  No hematuria.  Stop Metformin about 2 weeks ago as he thought this was contributing to his frequent urination.  Is otherwise been compliant with his other medications including his Norvasc and atorvastatin.  ROS: Per HPI  PMH: He reports that he has never smoked. He has never used smokeless tobacco. He reports that he does not drink alcohol or use drugs.      Objective:  Physical Exam: BP (!) 150/93   Pulse 81   Temp 98.3 F (36.8 C)   Ht 5\' 9"  (1.753 m)   Wt 194 lb 3.2 oz (88.1 kg)   SpO2 97%   BMI 28.68 kg/m   Wt Readings from Last 3 Encounters:  01/01/19 194 lb 3.2 oz (88.1 kg)  08/23/18 216 lb 6.1 oz (98.1 kg)  03/19/18 212 lb 9.6 oz (96.4 kg)    Gen: NAD, resting comfortably CV: Regular rate and rhythm with no murmurs appreciated Pulm: Normal work of breathing, clear to auscultation bilaterally with no crackles, wheezes, or rhonchi GI: Normal bowel sounds present.  Soft, Nontender, Nondistended. MSK: No edema, cyanosis, or clubbing noted Skin: Warm, dry Neuro: Grossly normal, moves all extremities Psych: Normal affect and thought content  Results for orders placed or performed in visit on 01/01/19 (from the past 24 hour(s))  POCT Urinalysis Dipstick (Automated)     Status: Abnormal   Collection Time: 01/01/19 10:47 AM  Result Value Ref Range   Color, UA Yellow    Clarity, UA Clear    Glucose, UA Positive (A) Negative   Bilirubin, UA Negative    Ketones, UA Positive    Spec Grav, UA 1.025 1.010 - 1.025   Blood, UA Positive    pH, UA 5.5 5.0 - 8.0   Protein, UA Negative Negative   Urobilinogen, UA 0.2 0.2 or 1.0 E.U./dL   Nitrite, UA Negative    Leukocytes, UA Negative Negative  POCT glucose (manual entry)     Status: Abnormal   Collection Time: 01/01/19 11:35 AM  Result Value Ref Range   POC Glucose 295 (A) 70 - 99 mg/dl        Mikita Lesmeister M. Jerline Pain, MD 01/01/2019 12:06 PM

## 2019-01-01 NOTE — Assessment & Plan Note (Signed)
Fasting glucose this morning 296.  A1c nondetectable.  Vital signs stable.  No signs of DKA.  Discussed diabetic diet and lifestyle modifications.  Will start empagliflozin-Metformin 12.05-998 twice daily.  He will follow-up in a few weeks.

## 2019-01-01 NOTE — Assessment & Plan Note (Signed)
Above goal.  Has not taken blood pressure medicine a couple of days.  Will start Norvasc 5 mg daily.  He will follow-up in a few weeks.  Continue home blood pressure monitoring goal 140/90 or lower.

## 2019-01-01 NOTE — Patient Instructions (Signed)
It was very nice to see you today!  Your blood sugar is very high.  I think this is the cause of your urinary issues.  Please start the new medication.  Stop your Metformin.  No other changes today.  Please come back to see me in a few weeks.  Take care, Dr Jerline Pain  Please try these tips to maintain a healthy lifestyle:   Eat at least 3 REAL meals and 1-2 snacks per day.  Aim for no more than 5 hours between eating.  If you eat breakfast, please do so within one hour of getting up.    Each meal should contain half fruits/vegetables, one quarter protein, and one quarter carbs (no bigger than a computer mouse)   Cut down on sweet beverages. This includes juice, soda, and sweet tea.     Drink at least 1 glass of water with each meal and aim for at least 8 glasses per day   Exercise at least 150 minutes every week.

## 2019-01-02 LAB — URINE CULTURE
MICRO NUMBER:: 1227038
Result:: NO GROWTH
SPECIMEN QUALITY:: ADEQUATE

## 2019-01-06 NOTE — Progress Notes (Signed)
Please inform patient of the following:  Urine culture is negative.  He does not have a UTI.

## 2019-02-26 ENCOUNTER — Ambulatory Visit: Payer: Federal, State, Local not specified - PPO | Admitting: Family Medicine

## 2019-03-21 ENCOUNTER — Encounter: Payer: Federal, State, Local not specified - PPO | Admitting: Family Medicine

## 2019-04-07 ENCOUNTER — Encounter: Payer: Self-pay | Admitting: Family Medicine

## 2019-04-07 ENCOUNTER — Other Ambulatory Visit: Payer: Self-pay

## 2019-04-07 ENCOUNTER — Ambulatory Visit (INDEPENDENT_AMBULATORY_CARE_PROVIDER_SITE_OTHER): Payer: Federal, State, Local not specified - PPO | Admitting: Family Medicine

## 2019-04-07 VITALS — BP 128/88 | HR 86 | Temp 97.7°F | Ht 69.0 in | Wt 189.9 lb

## 2019-04-07 DIAGNOSIS — E1165 Type 2 diabetes mellitus with hyperglycemia: Secondary | ICD-10-CM

## 2019-04-07 DIAGNOSIS — Z0001 Encounter for general adult medical examination with abnormal findings: Secondary | ICD-10-CM

## 2019-04-07 DIAGNOSIS — I10 Essential (primary) hypertension: Secondary | ICD-10-CM | POA: Diagnosis not present

## 2019-04-07 DIAGNOSIS — E785 Hyperlipidemia, unspecified: Secondary | ICD-10-CM | POA: Diagnosis not present

## 2019-04-07 DIAGNOSIS — Z125 Encounter for screening for malignant neoplasm of prostate: Secondary | ICD-10-CM | POA: Diagnosis not present

## 2019-04-07 DIAGNOSIS — N529 Male erectile dysfunction, unspecified: Secondary | ICD-10-CM | POA: Diagnosis not present

## 2019-04-07 DIAGNOSIS — R739 Hyperglycemia, unspecified: Secondary | ICD-10-CM

## 2019-04-07 DIAGNOSIS — R319 Hematuria, unspecified: Secondary | ICD-10-CM | POA: Diagnosis not present

## 2019-04-07 DIAGNOSIS — Z1211 Encounter for screening for malignant neoplasm of colon: Secondary | ICD-10-CM

## 2019-04-07 LAB — COMPREHENSIVE METABOLIC PANEL
ALT: 14 U/L (ref 0–53)
AST: 18 U/L (ref 0–37)
Albumin: 4.6 g/dL (ref 3.5–5.2)
Alkaline Phosphatase: 58 U/L (ref 39–117)
BUN: 22 mg/dL (ref 6–23)
CO2: 25 mEq/L (ref 19–32)
Calcium: 9.8 mg/dL (ref 8.4–10.5)
Chloride: 104 mEq/L (ref 96–112)
Creatinine, Ser: 1.1 mg/dL (ref 0.40–1.50)
GFR: 84.9 mL/min (ref >60.00)
Glucose, Bld: 144 mg/dL — ABNORMAL HIGH (ref 70–99)
Potassium: 3.9 mEq/L (ref 3.5–5.1)
Sodium: 140 mEq/L (ref 135–145)
Total Bilirubin: 0.7 mg/dL (ref 0.2–1.2)
Total Protein: 7 g/dL (ref 6.0–8.3)

## 2019-04-07 LAB — URINALYSIS, ROUTINE W REFLEX MICROSCOPIC
Bilirubin Urine: NEGATIVE
Hgb urine dipstick: NEGATIVE
Ketones, ur: 15 — AB
Leukocytes,Ua: NEGATIVE
Nitrite: NEGATIVE
RBC / HPF: NONE SEEN (ref 0–?)
Specific Gravity, Urine: 1.02 (ref 1.000–1.030)
Total Protein, Urine: NEGATIVE
Urine Glucose: >=1000 — AB
Urobilinogen, UA: 0.2 (ref 0.0–1.0)
WBC, UA: NONE SEEN (ref 0–?)
pH: 5.5 (ref 5.0–8.0)

## 2019-04-07 LAB — CBC
HCT: 44.2 % (ref 39.0–52.0)
Hemoglobin: 14.7 g/dL (ref 13.0–17.0)
MCHC: 33.2 g/dL (ref 30.0–36.0)
MCV: 88.5 fl (ref 78.0–100.0)
Platelets: 198 10*3/uL (ref 150.0–400.0)
RBC: 5 Mil/uL (ref 4.22–5.81)
RDW: 13.6 % (ref 11.5–15.5)
WBC: 7.3 10*3/uL (ref 4.0–10.5)

## 2019-04-07 LAB — LIPID PANEL
Cholesterol: 113 mg/dL (ref 0–200)
HDL: 37.4 mg/dL — ABNORMAL LOW (ref >39.00)
LDL Cholesterol: 56 mg/dL (ref 0–99)
NonHDL: 75.67
Total CHOL/HDL Ratio: 3
Triglycerides: 99 mg/dL (ref 0.0–149.0)
VLDL: 19.8 mg/dL (ref 0.0–40.0)

## 2019-04-07 LAB — TSH: TSH: 2.2 u[IU]/mL (ref 0.35–4.50)

## 2019-04-07 LAB — HEMOGLOBIN A1C: Hgb A1c MFr Bld: 12.8 % — ABNORMAL HIGH (ref 4.6–6.5)

## 2019-04-07 LAB — PSA: PSA: 1.22 ng/mL (ref 0.10–4.00)

## 2019-04-07 NOTE — Assessment & Plan Note (Signed)
Stable.  Continue Cialis 10 mg daily as needed. °

## 2019-04-07 NOTE — Assessment & Plan Note (Signed)
At goal.  Continue amlodipine 5 mg daily. 

## 2019-04-07 NOTE — Assessment & Plan Note (Signed)
Check A1c.  Continue empagliflozin-Metformin 12.05-998 twice daily.

## 2019-04-07 NOTE — Patient Instructions (Signed)
It was very nice to see you today!  We will check blood work and urine sample today.  No medication changes today.  We will see back in 3 to 6 months depending on results of your blood work.  Take care, Dr Jerline Pain  Please try these tips to maintain a healthy lifestyle:   Eat at least 3 REAL meals and 1-2 snacks per day.  Aim for no more than 5 hours between eating.  If you eat breakfast, please do so within one hour of getting up.    Each meal should contain half fruits/vegetables, one quarter protein, and one quarter carbs (no bigger than a computer mouse)   Cut down on sweet beverages. This includes juice, soda, and sweet tea.     Drink at least 1 glass of water with each meal and aim for at least 8 glasses per day   Exercise at least 150 minutes every week.    Preventive Care 38-80 Years Old, Male Preventive care refers to lifestyle choices and visits with your health care provider that can promote health and wellness. This includes:  A yearly physical exam. This is also called an annual well check.  Regular dental and eye exams.  Immunizations.  Screening for certain conditions.  Healthy lifestyle choices, such as eating a healthy diet, getting regular exercise, not using drugs or products that contain nicotine and tobacco, and limiting alcohol use. What can I expect for my preventive care visit? Physical exam Your health care provider will check:  Height and weight. These may be used to calculate body mass index (BMI), which is a measurement that tells if you are at a healthy weight.  Heart rate and blood pressure.  Your skin for abnormal spots. Counseling Your health care provider may ask you questions about:  Alcohol, tobacco, and drug use.  Emotional well-being.  Home and relationship well-being.  Sexual activity.  Eating habits.  Work and work Statistician. What immunizations do I need?  Influenza (flu) vaccine  This is recommended every  year. Tetanus, diphtheria, and pertussis (Tdap) vaccine  You may need a Td booster every 10 years. Varicella (chickenpox) vaccine  You may need this vaccine if you have not already been vaccinated. Zoster (shingles) vaccine  You may need this after age 65. Measles, mumps, and rubella (MMR) vaccine  You may need at least one dose of MMR if you were born in 1957 or later. You may also need a second dose. Pneumococcal conjugate (PCV13) vaccine  You may need this if you have certain conditions and were not previously vaccinated. Pneumococcal polysaccharide (PPSV23) vaccine  You may need one or two doses if you smoke cigarettes or if you have certain conditions. Meningococcal conjugate (MenACWY) vaccine  You may need this if you have certain conditions. Hepatitis A vaccine  You may need this if you have certain conditions or if you travel or work in places where you may be exposed to hepatitis A. Hepatitis B vaccine  You may need this if you have certain conditions or if you travel or work in places where you may be exposed to hepatitis B. Haemophilus influenzae type b (Hib) vaccine  You may need this if you have certain risk factors. Human papillomavirus (HPV) vaccine  If recommended by your health care provider, you may need three doses over 6 months. You may receive vaccines as individual doses or as more than one vaccine together in one shot (combination vaccines). Talk with your health care provider about  the risks and benefits of combination vaccines. What tests do I need? Blood tests  Lipid and cholesterol levels. These may be checked every 5 years, or more frequently if you are over 47 years old.  Hepatitis C test.  Hepatitis B test. Screening  Lung cancer screening. You may have this screening every year starting at age 57 if you have a 30-pack-year history of smoking and currently smoke or have quit within the past 15 years.  Prostate cancer screening.  Recommendations will vary depending on your family history and other risks.  Colorectal cancer screening. All adults should have this screening starting at age 52 and continuing until age 57. Your health care provider may recommend screening at age 17 if you are at increased risk. You will have tests every 1-10 years, depending on your results and the type of screening test.  Diabetes screening. This is done by checking your blood sugar (glucose) after you have not eaten for a while (fasting). You may have this done every 1-3 years.  Sexually transmitted disease (STD) testing. Follow these instructions at home: Eating and drinking  Eat a diet that includes fresh fruits and vegetables, whole grains, lean protein, and low-fat dairy products.  Take vitamin and mineral supplements as recommended by your health care provider.  Do not drink alcohol if your health care provider tells you not to drink.  If you drink alcohol: ? Limit how much you have to 0-2 drinks a day. ? Be aware of how much alcohol is in your drink. In the [place] one drink equals one 12 oz bottle of beer (355 mL), one 5 oz glass of wine (148 mL), or one 1 oz glass of hard liquor (44 mL). Lifestyle  Take daily care of your teeth and gums.  Stay active. Exercise for at least 30 minutes on 5 or more days each week.  Do not use any products that contain nicotine or tobacco, such as cigarettes, e-cigarettes, and chewing tobacco. If you need help quitting, ask your health care provider.  If you are sexually active, practice safe sex. Use a condom or other form of protection to prevent STIs (sexually transmitted infections).  Talk with your health care provider about taking a low-dose aspirin every day starting at age 20. What's next?  Go to your health care provider once a year for a well check visit.  Ask your health care provider how often you should have your eyes and teeth checked.  Stay up to date on all vaccines. This  information is not intended to replace advice given to you by your health care provider. Make sure you discuss any questions you have with your health care provider. Document Revised: 12/20/2017 Document Reviewed: 12/20/2017 Elsevier Patient Education  2020 [employer].

## 2019-04-07 NOTE — Assessment & Plan Note (Signed)
Continue Lipitor 40 mg daily.  Check lipid panel, CBC, C met, TSH.

## 2019-04-07 NOTE — Progress Notes (Signed)
Chief Complaint:  Clinton Church is a 54 y.o. male who presents today for his annual comprehensive physical exam.    Assessment/Plan:  Chronic Problems Addressed Today: Erectile dysfunction Stable.  Continue Cialis 10 mg daily as needed.  Dyslipidemia Continue Lipitor 40 mg daily.  Check lipid panel, CBC, C met, TSH.  Essential hypertension At goal.  Continue amlodipine 5 mg daily.  Type 2 diabetes mellitus with hyperglycemia (HCC) Check A1c.  Continue empagliflozin-Metformin 12.05-998 twice daily.  Preventative Healthcare: Check CBC, C met, TSH, PSA.  Will place order for Cologuard as well.  Patient Counseling(The following topics were reviewed and/or handout was given):  -Nutrition: Stressed importance of moderation in sodium/caffeine intake, saturated fat and cholesterol, caloric balance, sufficient intake of fresh fruits, vegetables, and fiber.  -Stressed the importance of regular exercise.   -Substance Abuse: Discussed cessation/primary prevention of tobacco, alcohol, or other drug use; driving or other dangerous activities under the influence; availability of treatment for abuse.   -Injury prevention: Discussed safety belts, safety helmets, smoke detector, smoking near bedding or upholstery.   -Sexuality: Discussed sexually transmitted diseases, partner selection, use of condoms, avoidance of unintended pregnancy and contraceptive alternatives.   -Dental health: Discussed importance of regular tooth brushing, flossing, and dental visits.  -Health maintenance and immunizations reviewed. Please refer to Health maintenance section.  Return to care in 1 year for next preventative visit.     Subjective:  HPI:  He has no acute complaints today.   Lifestyle Diet: Cutting out sugars and carbs.  Exercise: busy at work.   Depression screen [facility] 2/9 04/07/2019  Decreased Interest 0  Down, Depressed, Hopeless 0  PHQ - 2 Score 0  Altered sleeping 0  Tired, decreased energy 0   Change in appetite 0  Feeling bad or failure about yourself  0  Trouble concentrating 0  Moving slowly or fidgety/restless 0  Suicidal thoughts 0  PHQ-9 Score 0   Health Maintenance Due  Topic Date Due  . FOOT EXAM  Never done  . OPHTHALMOLOGY EXAM  Never done  . HIV Screening  Never done  . COLONOSCOPY  Never done  . HEMOGLOBIN A1C  02/23/2019    ROS: Per HPI, otherwise a complete review of systems was negative.   PMH:  The following were reviewed and entered/updated in epic: Past Medical History:  Diagnosis Date  . Hypercholesterolemia   . Hypertension    Patient Active Problem List   Diagnosis Date Noted  . Erectile dysfunction 08/23/2018  . Type 2 diabetes mellitus with hyperglycemia ([place]) 03/19/2018  . Essential hypertension 03/19/2018  . Dyslipidemia 03/19/2018   History reviewed. No pertinent surgical history.  Family History  Problem Relation Age of Onset  . Diabetes Mother   . Lung cancer Father   . Prostate cancer Neg Hx   . Colon cancer Neg Hx     Medications- reviewed and updated Current Outpatient Medications  Medication Sig Dispense Refill  . amLODipine (NORVASC) 5 MG tablet Take 1 tablet (5 mg total) by mouth daily. 90 tablet 3  . atorvastatin (LIPITOR) 40 MG tablet Take 1 tablet (40 mg total) by mouth daily. 90 tablet 3  . Empagliflozin-metFORMIN HCl (SYNJARDY) 12.05-998 MG TABS Take by mouth.    . tadalafil (CIALIS) 10 MG tablet Take 1 tablet (10 mg total) by mouth daily as needed for erectile dysfunction. 30 tablet 0   No current facility-administered medications for this visit.    Allergies-reviewed and updated No Known Allergies  Social History   Socioeconomic History  . Marital status: Married    Spouse name: Not on file  . Number of children: Not on file  . Years of education: Not on file  . Highest education level: Not on file  Occupational History  . Not on file  Tobacco Use  . Smoking status: Never Smoker  . Smokeless  tobacco: Never Used  Substance and Sexual Activity  . Alcohol use: No  . Drug use: No  . Sexual activity: Not on file  Other Topics Concern  . Not on file  Social History Narrative  . Not on file   Social Determinants of Health   Financial Resource Strain:   . Difficulty of Paying Living Expenses:   Food Insecurity:   . Worried About Charity fundraiser in the Last Year:   . Arboriculturist in the Last Year:   Transportation Needs:   . Film/video editor (Medical):   Marland Kitchen Lack of Transportation (Non-Medical):   Physical Activity:   . Days of Exercise per Week:   . Minutes of Exercise per Session:   Stress:   . Feeling of Stress :   Social Connections:   . Frequency of Communication with Friends and Family:   . Frequency of Social Gatherings with Friends and Family:   . Attends Religious Services:   . Active Member of Clubs or Organizations:   . Attends Archivist Meetings:   Marland Kitchen Marital Status:         Objective:  Physical Exam: BP 128/88 (BP Location: Right Arm, Patient Position: Sitting, Cuff Size: Normal)   Pulse 86   Temp 97.7 F (36.5 C) (Bladder)   Ht _0  (1.753 m)   Wt 189 lb 14.4 oz (86.1 kg)   SpO2 97%   BMI 28.04 kg/m   Body mass index is 28.04 kg/m. Wt Readings from Last 3 Encounters:  04/07/19 189 lb 14.4 oz (86.1 kg)  01/01/19 194 lb 3.2 oz (88.1 kg)  08/23/18 216 lb 6.1 oz (98.1 kg)   Gen: NAD, resting comfortably HEENT: TMs normal bilaterally. OP clear. No thyromegaly noted.  CV: RRR with no murmurs appreciated Pulm: NWOB, CTAB with no crackles, wheezes, or rhonchi GI: Normal bowel sounds present. Soft, Nontender, Nondistended. MSK: no edema, cyanosis, or clubbing noted Skin: warm, dry Neuro: CN2-12 grossly intact. Strength 5/5 in upper and lower extremities. Reflexes symmetric and intact bilaterally.  Psych: Normal affect and thought content     Harla Mensch M. Jerline Pain, MD 04/07/2019 9:57 AM

## 2019-04-08 ENCOUNTER — Other Ambulatory Visit: Payer: Self-pay

## 2019-04-08 ENCOUNTER — Telehealth: Payer: Self-pay | Admitting: Family Medicine

## 2019-04-08 MED ORDER — RYBELSUS 3 MG PO TABS: 3.0000 mg | ORAL_TABLET | Freq: Every day | ORAL | 3 refills | Status: AC

## 2019-04-08 NOTE — Progress Notes (Signed)
Please inform patient of the following:  His A1c is very high but the rest of his blood work is all stable. It would be a good idea to start an additional medication to help with his blood sugars. Recommend starting ozempic 0.25mg  once weekly. Please send in if patient is willing to start. He can come to the office or the pharmacist should be able to demonstrate how to use it.  Would like for him to come back in 3 months to recheck his A1c.  Katina Degree. [name], MD 04/08/2019 9:24 AM

## 2019-04-08 NOTE — Telephone Encounter (Signed)
Patient notified Rx sent

## 2019-04-08 NOTE — Telephone Encounter (Signed)
Please advised  

## 2019-04-08 NOTE — Telephone Encounter (Signed)
Patient has called back.  I have given patient Dr. Lavone Neri response on his labs.  Patient understood.  However, patient wanted to know if there was an additional oral medication he could take instead of the ozempic? I have scheduled patients 3 month A1C follow up.

## 2019-04-08 NOTE — Telephone Encounter (Signed)
We can try rybelsus instead of ozempic, but not sure if insurance will pay for it. Please send in 3mg  tablet take 1 daily.   . Katina Degree, MD 04/08/2019 1:05 PM

## 2019-04-09 ENCOUNTER — Encounter: Payer: Self-pay | Admitting: Family Medicine

## 2019-04-10 ENCOUNTER — Other Ambulatory Visit: Payer: Self-pay | Admitting: *Deleted

## 2019-04-10 MED ORDER — OZEMPIC (0.25 OR 0.5 MG/DOSE) 2 MG/1.5ML ~~LOC~~ SOPN: 0.5000 mg | PEN_INJECTOR | SUBCUTANEOUS | 3 refills | Status: DC

## 2019-04-10 NOTE — Telephone Encounter (Signed)
Please advised  

## 2019-04-10 NOTE — Telephone Encounter (Signed)
Please advise 

## 2019-04-18 ENCOUNTER — Encounter: Payer: Self-pay | Admitting: Family Medicine

## 2019-04-18 ENCOUNTER — Other Ambulatory Visit: Payer: Self-pay | Admitting: *Deleted

## 2019-04-18 DIAGNOSIS — R739 Hyperglycemia, unspecified: Secondary | ICD-10-CM

## 2019-04-18 NOTE — Progress Notes (Signed)
a1c

## 2019-04-21 ENCOUNTER — Other Ambulatory Visit: Payer: Self-pay | Admitting: Family Medicine

## 2019-04-22 ENCOUNTER — Other Ambulatory Visit: Payer: Self-pay

## 2019-04-22 ENCOUNTER — Ambulatory Visit: Payer: Federal, State, Local not specified - PPO

## 2019-04-22 ENCOUNTER — Other Ambulatory Visit (INDEPENDENT_AMBULATORY_CARE_PROVIDER_SITE_OTHER): Payer: Federal, State, Local not specified - PPO

## 2019-04-22 ENCOUNTER — Other Ambulatory Visit: Payer: Federal, State, Local not specified - PPO

## 2019-04-22 DIAGNOSIS — R739 Hyperglycemia, unspecified: Secondary | ICD-10-CM

## 2019-04-22 NOTE — Progress Notes (Unsigned)
Pt present for recheck A1C  Stating taking Rx as prescribed

## 2019-04-23 LAB — HEMOGLOBIN A1C: Hgb A1c MFr Bld: 10.7 % — ABNORMAL HIGH (ref 4.6–6.5)

## 2019-04-24 ENCOUNTER — Other Ambulatory Visit: Payer: Self-pay | Admitting: *Deleted

## 2019-04-24 DIAGNOSIS — R739 Hyperglycemia, unspecified: Secondary | ICD-10-CM

## 2019-04-24 NOTE — Progress Notes (Signed)
Please inform patient of the following:  A1c improved but still not at goal. Recommend increasing ozepmic to 0.5mg  weekly.  Clinton Church. [name], MD 04/24/2019 9:30 AM

## 2019-05-06 ENCOUNTER — Other Ambulatory Visit: Payer: Self-pay

## 2019-05-06 ENCOUNTER — Other Ambulatory Visit (INDEPENDENT_AMBULATORY_CARE_PROVIDER_SITE_OTHER): Payer: Federal, State, Local not specified - PPO

## 2019-05-06 DIAGNOSIS — R739 Hyperglycemia, unspecified: Secondary | ICD-10-CM

## 2019-05-07 ENCOUNTER — Encounter: Payer: Self-pay | Admitting: Family Medicine

## 2019-05-07 LAB — HEMOGLOBIN A1C: Hgb A1c MFr Bld: 9.4 % — ABNORMAL HIGH (ref 4.6–6.5)

## 2019-05-07 NOTE — Progress Notes (Signed)
Please inform patient of the following:  A1c improved to 9.4. Would like for him to keep at current dose and we can recheck in 3 months.  Katina Degree. [name], MD 05/07/2019 11:29 AM

## 2019-05-29 LAB — COLOGUARD
COLOGUARD: NEGATIVE
Cologuard: NEGATIVE

## 2019-06-02 ENCOUNTER — Encounter: Payer: Self-pay | Admitting: Family Medicine

## 2019-07-09 ENCOUNTER — Ambulatory Visit: Payer: Federal, State, Local not specified - PPO | Admitting: Family Medicine

## 2019-07-21 ENCOUNTER — Other Ambulatory Visit: Payer: Self-pay | Admitting: Family Medicine

## 2019-07-30 ENCOUNTER — Ambulatory Visit: Payer: Federal, State, Local not specified - PPO | Admitting: Family Medicine

## 2019-07-30 ENCOUNTER — Other Ambulatory Visit: Payer: Self-pay

## 2019-07-30 ENCOUNTER — Encounter: Payer: Self-pay | Admitting: Family Medicine

## 2019-07-30 VITALS — BP 152/99 | HR 79 | Temp 98.2°F | Ht 69.0 in | Wt 179.2 lb

## 2019-07-30 DIAGNOSIS — R739 Hyperglycemia, unspecified: Secondary | ICD-10-CM | POA: Diagnosis not present

## 2019-07-30 DIAGNOSIS — E785 Hyperlipidemia, unspecified: Secondary | ICD-10-CM | POA: Diagnosis not present

## 2019-07-30 DIAGNOSIS — N529 Male erectile dysfunction, unspecified: Secondary | ICD-10-CM

## 2019-07-30 DIAGNOSIS — E1165 Type 2 diabetes mellitus with hyperglycemia: Secondary | ICD-10-CM | POA: Diagnosis not present

## 2019-07-30 DIAGNOSIS — I10 Essential (primary) hypertension: Secondary | ICD-10-CM

## 2019-07-30 LAB — POCT GLYCOSYLATED HEMOGLOBIN (HGB A1C): Hemoglobin A1C: 5.5 % (ref 4.0–5.6)

## 2019-07-30 NOTE — Assessment & Plan Note (Signed)
Doing well without Cialis now.

## 2019-07-30 NOTE — Assessment & Plan Note (Signed)
A1c 5.5.  Congratulated patient.  Continue current doses of Synjardy and Ozempic.  Follow-up in 6 months.  If continues to be low will likely back off medications.

## 2019-07-30 NOTE — Patient Instructions (Addendum)
It was very nice to see you today!  Your A1c looks great!  Keep up the good work!  We will see you back in 6 months, or sooner id needed.  Take care, Dr [name]  Please try these tips to maintain a healthy lifestyle:   Eat at least 3 REAL meals and 1-2 snacks per day.  Aim for no more than 5 hours between eating.  If you eat breakfast, please do so within one hour of getting up.    Each meal should contain half fruits/vegetables, one quarter protein, and one quarter carbs (no bigger than a computer mouse)   Cut down on sweet beverages. This includes juice, soda, and sweet tea.     Drink at least 1 glass of water with each meal and aim for at least 8 glasses per day   Exercise at least 150 minutes every week.

## 2019-07-30 NOTE — Progress Notes (Signed)
   Clinton Church is a 54 y.o. male who presents today for an office visit.  Assessment/Plan:  New/Acute Problems: Stress Offered words of encouragement.  He has a good support system in place.  He will let us know if he needs any further assitance.   Chronic Problems Addressed Today: Dyslipidemia Continue Lipitor 40 mg daily.  Tolerating well.  Essential hypertension Slightly above goal.  Has been under quite a bit of stress recently related to his diagnosis of his grandson.  Will recheck at next visit.  Continue home monitoring goal 140/90 or lower.  Type 2 diabetes mellitus with hyperglycemia (HCC) A1c 5.5.  Congratulated patient.  Continue current doses of Synjardy and Ozempic.  Follow-up in 6 months.  If continues to be low will likely back off medications.  Erectile dysfunction Doing well without Cialis now.     Subjective:  HPI:    Grandson diagnosed with cancer several weeks ago.  He has been a new chemo.  This is been a significant source of stress for him.  Overall feels like he is handling it as well as possible.       Objective:  Physical Exam: BP (!) 152/99   Pulse 79   Temp 98.2 F (36.8 C)   Ht 5\' 9"  (1.753 m)   Wt 179 lb 4 oz (81.3 kg)   SpO2 98%   BMI 26.47 kg/m   Wt Readings from Last 3 Encounters:  07/30/19 179 lb 4 oz (81.3 kg)  04/07/19 189 lb 14.4 oz (86.1 kg)  01/01/19 194 lb 3.2 oz (88.1 kg)  Gen: No acute distress, resting comfortably Neuro: Grossly normal, moves all extremities Psych: Normal affect and thought content      Jolleen Seman M. 01/03/19, MD 07/30/2019 10:01 AM

## 2019-07-30 NOTE — Assessment & Plan Note (Signed)
Slightly above goal.  Has been under quite a bit of stress recently related to his diagnosis of his grandson.  Will recheck at next visit.  Continue home monitoring goal 140/90 or lower.

## 2019-07-30 NOTE — Assessment & Plan Note (Signed)
Continue Lipitor 40 mg daily.  Tolerating well.

## 2019-10-10 ENCOUNTER — Other Ambulatory Visit: Payer: Self-pay | Admitting: Family Medicine

## 2020-02-01 ENCOUNTER — Other Ambulatory Visit: Payer: Self-pay | Admitting: Family Medicine

## 2020-02-04 ENCOUNTER — Ambulatory Visit: Payer: Federal, State, Local not specified - PPO | Admitting: Family Medicine

## 2020-02-16 ENCOUNTER — Ambulatory Visit: Payer: Federal, State, Local not specified - PPO | Admitting: Family Medicine

## 2020-02-16 ENCOUNTER — Encounter: Payer: Self-pay | Admitting: Family Medicine

## 2020-02-16 ENCOUNTER — Other Ambulatory Visit: Payer: Self-pay

## 2020-02-16 VITALS — BP 162/97 | HR 84 | Temp 98.6°F | Ht 69.0 in | Wt 199.8 lb

## 2020-02-16 DIAGNOSIS — E785 Hyperlipidemia, unspecified: Secondary | ICD-10-CM

## 2020-02-16 DIAGNOSIS — I10 Essential (primary) hypertension: Secondary | ICD-10-CM | POA: Diagnosis not present

## 2020-02-16 DIAGNOSIS — M25562 Pain in left knee: Secondary | ICD-10-CM | POA: Insufficient documentation

## 2020-02-16 DIAGNOSIS — E1165 Type 2 diabetes mellitus with hyperglycemia: Secondary | ICD-10-CM

## 2020-02-16 DIAGNOSIS — G8929 Other chronic pain: Secondary | ICD-10-CM

## 2020-02-16 LAB — POCT GLYCOSYLATED HEMOGLOBIN (HGB A1C): Hemoglobin A1C: 5.5 % (ref 4.0–5.6)

## 2020-02-16 NOTE — Progress Notes (Signed)
   Clinton Church is a 55 y.o. male who presents today for an office visit.  Assessment/Plan:  Chronic Problems Addressed Today: Left knee pain Possibly has slight underlying osteoarthritis.  Also has several varicosities which could be playing a role.  Recommended compression and ice to the area as needed.  If not improving would consider x-ray or possible referral to sports medicine.  He can continue using over-the-counter meds as needed.  Dyslipidemia Doing well with Lipitor 40 mg daily.  He will come back in 3 to 6 months for CPE and we can check lipids at that time.  Essential hypertension Elevated today.  Has been under quite a bit of stress recently.  Previously well controlled.  Will continue amlodipine 5 mg daily.  Recheck in 3 to 6 months.  Type 2 diabetes mellitus with hyperglycemia (HCC) A1c stable at 5.5.  Congratulated patient.  We will stop Synjardy as he is having some issues with fatigue that he thinks is due to this medication.  We will continue Ozempic 0.5 mg weekly.  Continue lifestyle modifications.  Follow-up in 3 to 6 months for CPE and recheck A1c at that time.     Subjective:  HPI:  See a/p for status of chronic conditions.  He is overall doing well.  He has had having more issues with left knee pain and swelling for the last several months.  No obvious injuries or precipitating events.  No specific treatments tried.       Objective:  Physical Exam: BP (!) 162/97   Pulse 84   Temp 98.6 F (37 C) (Temporal)   Ht 5\' 9"  (1.753 m)   Wt 199 lb 12.8 oz (90.6 kg)   SpO2 99%   BMI 29.51 kg/m   Wt Readings from Last 3 Encounters:  02/16/20 199 lb 12.8 oz (90.6 kg)  07/30/19 179 lb 4 oz (81.3 kg)  04/07/19 189 lb 14.4 oz (86.1 kg)  Gen: No acute distress, resting comfortably CV: Regular rate and rhythm with no murmurs appreciated Pulm: Normal work of breathing, clear to auscultation bilaterally with no crackles, wheezes, or rhonchi MSK: Varicosities noted  on left lower extremity.  Full range of motion throughout.  Very slight crepitus with active and passive range of motion.  Stable to varus and valgus stress.  Anterior and posterior drawer signs negative. Neuro: Grossly normal, moves all extremities Psych: Normal affect and thought content      Jozee Hammer M. 04/09/19, MD 02/16/2020 10:45 AM

## 2020-02-16 NOTE — Assessment & Plan Note (Signed)
Elevated today.  Has been under quite a bit of stress recently.  Previously well controlled.  Will continue amlodipine 5 mg daily.  Recheck in 3 to 6 months.

## 2020-02-16 NOTE — Patient Instructions (Signed)
It was very nice to see you today!  Your A1c is 5.5 Please stop the synjardy.   Please try using a compression sleeve on your knee.  I will see you back in 3-6 months for you annual physical   Take care, Dr [name]  Please try these tips to maintain a healthy lifestyle:   Eat at least 3 REAL meals and 1-2 snacks per day.  Aim for no more than 5 hours between eating.  If you eat breakfast, please do so within one hour of getting up.    Each meal should contain half fruits/vegetables, one quarter protein, and one quarter carbs (no bigger than a computer mouse)   Cut down on sweet beverages. This includes juice, soda, and sweet tea.     Drink at least 1 glass of water with each meal and aim for at least 8 glasses per day   Exercise at least 150 minutes every week.

## 2020-02-16 NOTE — Assessment & Plan Note (Signed)
Possibly has slight underlying osteoarthritis.  Also has several varicosities which could be playing a role.  Recommended compression and ice to the area as needed.  If not improving would consider x-ray or possible referral to sports medicine.  He can continue using over-the-counter meds as needed.

## 2020-02-16 NOTE — Assessment & Plan Note (Signed)
A1c stable at 5.5.  Congratulated patient.  We will stop Synjardy as he is having some issues with fatigue that he thinks is due to this medication.  We will continue Ozempic 0.5 mg weekly.  Continue lifestyle modifications.  Follow-up in 3 to 6 months for CPE and recheck A1c at that time.

## 2020-02-16 NOTE — Assessment & Plan Note (Signed)
Doing well with Lipitor 40 mg daily.  He will come back in 3 to 6 months for CPE and we can check lipids at that time.

## 2020-04-09 ENCOUNTER — Other Ambulatory Visit: Payer: Self-pay

## 2020-04-09 ENCOUNTER — Encounter: Payer: Self-pay | Admitting: Family Medicine

## 2020-04-09 ENCOUNTER — Ambulatory Visit: Payer: Federal, State, Local not specified - PPO | Admitting: Family Medicine

## 2020-04-09 VITALS — BP 168/104 | HR 72 | Temp 98.3°F | Ht 69.0 in | Wt 194.2 lb

## 2020-04-09 DIAGNOSIS — I10 Essential (primary) hypertension: Secondary | ICD-10-CM

## 2020-04-09 DIAGNOSIS — M545 Low back pain, unspecified: Secondary | ICD-10-CM | POA: Diagnosis not present

## 2020-04-09 MED ORDER — DICLOFENAC SODIUM 75 MG PO TBEC
75.0000 mg | DELAYED_RELEASE_TABLET | Freq: Two times a day (BID) | ORAL | 0 refills | Status: DC
Start: 2020-04-09 — End: 2020-08-09

## 2020-04-09 MED ORDER — CYCLOBENZAPRINE HCL 10 MG PO TABS: 10.0000 mg | ORAL_TABLET | Freq: Three times a day (TID) | ORAL | 0 refills | Status: DC | PRN

## 2020-04-09 NOTE — Patient Instructions (Signed)
It was very nice to see you today!  You have a pulled muscle in your back.  Please start the diclofenac and Flexeril.  Please work on the exercises.  You can use a heating pad as needed.  Please let me know if your symptoms not improving in the next 1 to 2 weeks.  Take care, Dr [name]  PLEASE NOTE:  If you had any lab tests please let us know if you have not heard back within a few days. You may see your results on mychart before we have a chance to review them but we will give you a call once they are reviewed by [place]. If we ordered any referrals today, please let us know if you have not heard from their office within the next week.   Please try these tips to maintain a healthy lifestyle:   Eat at least 3 REAL meals and 1-2 snacks per day.  Aim for no more than 5 hours between eating.  If you eat breakfast, please do so within one hour of getting up.    Each meal should contain half fruits/vegetables, one quarter protein, and one quarter carbs (no bigger than a computer mouse)   Cut down on sweet beverages. This includes juice, soda, and sweet tea.     Drink at least 1 glass of water with each meal and aim for at least 8 glasses per day   Exercise at least 150 minutes every week.

## 2020-04-09 NOTE — Assessment & Plan Note (Signed)
Elevated today in setting of acute pain.  Continue amlodipine 5 mg daily.  Will recheck at next office visit.

## 2020-04-09 NOTE — Progress Notes (Signed)
   Clinton Church is a 55 y.o. male who presents today for an office visit.  Assessment/Plan:  New/Acute Problems: Back Pain No red flags.  Consistent with muscular strain.  Will start diclofenac and Flexeril.  Recommended heating pad.  Discussed home exercises and handout was given.  Advised him to avoid activities that worsen pain over the next few weeks.  Discussed reasons to return to care.  Follow-up as needed.  Chronic Problems Addressed Today: Essential hypertension Elevated today in setting of acute pain.  Continue amlodipine 5 mg daily.  Will recheck at next office visit.    Subjective:  HPI:  Patient here for low back pain.  Started a week ago.  Was doing yard work when he lifts concrete blocks.  Immediately noted pain to his lower back.  Pain has persisted since then.  Will sometimes go into upper legs.  Some weakness when the pain is severe.  No bowel or bladder incontinence.  Heating pad helps.  Ibuprofen helps.  Sitting in a hot tub helps as well.  Worse with lying down.       Objective:  Physical Exam: BP (!) 168/104   Pulse 72   Temp 98.3 F (36.8 C) (Temporal)   Ht 5\' 9"  (1.753 m)   Wt 194 lb 3.2 oz (88.1 kg)   SpO2 99%   BMI 28.68 kg/m   Gen: No acute distress, resting comfortably MSK: - Back: No deformities.  Tender to palpation along lower lumbar paraspinal muscles. Neuro: Grossly normal, moves all extremities Psych: Normal affect and thought content      Jacquelyne Quarry M. , MD 04/09/2020 11:05 AM

## 2020-04-19 ENCOUNTER — Encounter: Payer: Self-pay | Admitting: Family Medicine

## 2020-04-20 NOTE — Telephone Encounter (Signed)
Please advise 

## 2020-05-17 ENCOUNTER — Ambulatory Visit: Payer: Federal, State, Local not specified - PPO | Admitting: Family Medicine

## 2020-05-31 ENCOUNTER — Encounter: Payer: Self-pay | Admitting: Family Medicine

## 2020-05-31 ENCOUNTER — Ambulatory Visit: Payer: Federal, State, Local not specified - PPO | Admitting: Family Medicine

## 2020-05-31 ENCOUNTER — Other Ambulatory Visit: Payer: Self-pay

## 2020-05-31 VITALS — BP 138/89 | HR 76 | Temp 98.1°F | Ht 69.0 in | Wt 193.2 lb

## 2020-05-31 DIAGNOSIS — E1165 Type 2 diabetes mellitus with hyperglycemia: Secondary | ICD-10-CM

## 2020-05-31 DIAGNOSIS — I10 Essential (primary) hypertension: Secondary | ICD-10-CM

## 2020-05-31 LAB — POCT GLYCOSYLATED HEMOGLOBIN (HGB A1C): Hemoglobin A1C: 5.7 % — AB (ref 4.0–5.6)

## 2020-05-31 NOTE — Assessment & Plan Note (Signed)
A1c stable at 5.7.  Congratulated patient.  He has made a lot of great changes with diet and exercise.  We will recheck A1c in 3 months at his CPE.  If continues to be well controlled we can stop his Ozempic.

## 2020-05-31 NOTE — Patient Instructions (Signed)
It was very nice to see you today!  Your A1c looks good today at 5.7.  Please continue taking your Ozempic.  Please continue working on diet and exercise.  I would like to see you back in 3 months for your annual physical.  We can recheck your A1c at that time.  We may be able to stop your Ozempic depending on results.  Take care, Dr [name]  PLEASE NOTE:  If you had any lab tests please let us know if you have not heard back within a few days. You may see your results on mychart before we have a chance to review them but we will give you a call once they are reviewed by [place]. If we ordered any referrals today, please let us know if you have not heard from their office within the next week.   Please try these tips to maintain a healthy lifestyle:   Eat at least 3 REAL meals and 1-2 snacks per day.  Aim for no more than 5 hours between eating.  If you eat breakfast, please do so within one hour of getting up.    Each meal should contain half fruits/vegetables, one quarter protein, and one quarter carbs (no bigger than a computer mouse)   Cut down on sweet beverages. This includes juice, soda, and sweet tea.     Drink at least 1 glass of water with each meal and aim for at least 8 glasses per day   Exercise at least 150 minutes every week.

## 2020-05-31 NOTE — Progress Notes (Signed)
   Clinton Church is a 55 y.o. male who presents today for an office visit.  Assessment/Plan:  Chronic Problems Addressed Today: Essential hypertension At goal.  Continue amlodipine 5 mg daily.  Type 2 diabetes mellitus with hyperglycemia (HCC) A1c stable at 5.7.  Congratulated patient.  He has made a lot of great changes with diet and exercise.  We will recheck A1c in 3 months at his CPE.  If continues to be well controlled we can stop his Ozempic.    Subjective:  HPI:  See a/p.         Objective:  Physical Exam: BP 138/89   Pulse 76   Temp 98.1 F (36.7 C) (Temporal)   Ht 5\' 9"  (1.753 m)   Wt 193 lb 3.2 oz (87.6 kg)   SpO2 99%   BMI 28.53 kg/m   Wt Readings from Last 3 Encounters:  05/31/20 193 lb 3.2 oz (87.6 kg)  04/09/20 194 lb 3.2 oz (88.1 kg)  02/16/20 199 lb 12.8 oz (90.6 kg)    Gen: No acute distress, resting comfortably CV: Regular rate and rhythm with no murmurs appreciated Pulm: Normal work of breathing, clear to auscultation bilaterally with no crackles, wheezes, or rhonchi Neuro: Grossly normal, moves all extremities Psych: Normal affect and thought content      Clinton Church M. 04/15/20, MD 05/31/2020 2:44 PM

## 2020-05-31 NOTE — Assessment & Plan Note (Signed)
At goal.  Continue amlodipine 5 mg daily. 

## 2020-06-13 ENCOUNTER — Other Ambulatory Visit: Payer: Self-pay | Admitting: Family Medicine

## 2020-07-28 ENCOUNTER — Other Ambulatory Visit: Payer: Self-pay | Admitting: Family Medicine

## 2020-07-30 ENCOUNTER — Other Ambulatory Visit: Payer: Self-pay | Admitting: Family Medicine

## 2020-07-30 ENCOUNTER — Other Ambulatory Visit: Payer: Self-pay | Admitting: *Deleted

## 2020-07-30 MED ORDER — ATORVASTATIN CALCIUM 40 MG PO TABS: 40.0000 mg | ORAL_TABLET | Freq: Every day | ORAL | 1 refills | Status: DC

## 2020-08-09 ENCOUNTER — Other Ambulatory Visit: Payer: Self-pay

## 2020-08-09 ENCOUNTER — Encounter: Payer: Self-pay | Admitting: Family Medicine

## 2020-08-09 MED ORDER — OZEMPIC (0.25 OR 0.5 MG/DOSE) 2 MG/1.5ML ~~LOC~~ SOPN: 0.5000 mg | PEN_INJECTOR | SUBCUTANEOUS | 3 refills | Status: DC

## 2020-08-09 MED ORDER — DICLOFENAC SODIUM 75 MG PO TBEC: 75.0000 mg | DELAYED_RELEASE_TABLET | Freq: Two times a day (BID) | ORAL | 0 refills | Status: DC

## 2020-08-09 MED ORDER — AMLODIPINE BESYLATE 5 MG PO TABS: 5.0000 mg | ORAL_TABLET | Freq: Every day | ORAL | 3 refills | Status: DC

## 2020-08-09 MED ORDER — ATORVASTATIN CALCIUM 40 MG PO TABS: 40.0000 mg | ORAL_TABLET | Freq: Every day | ORAL | 1 refills | Status: DC

## 2020-08-31 ENCOUNTER — Encounter: Payer: Federal, State, Local not specified - PPO | Admitting: Family Medicine

## 2020-10-25 ENCOUNTER — Ambulatory Visit (INDEPENDENT_AMBULATORY_CARE_PROVIDER_SITE_OTHER): Payer: Federal, State, Local not specified - PPO | Admitting: Family Medicine

## 2020-10-25 ENCOUNTER — Other Ambulatory Visit: Payer: Federal, State, Local not specified - PPO

## 2020-10-25 ENCOUNTER — Other Ambulatory Visit: Payer: Self-pay

## 2020-10-25 ENCOUNTER — Encounter: Payer: Self-pay | Admitting: Family Medicine

## 2020-10-25 VITALS — BP 156/97 | HR 77 | Temp 98.8°F | Ht 69.0 in | Wt 204.2 lb

## 2020-10-25 DIAGNOSIS — Z6830 Body mass index (BMI) 30.0-30.9, adult: Secondary | ICD-10-CM

## 2020-10-25 DIAGNOSIS — Z125 Encounter for screening for malignant neoplasm of prostate: Secondary | ICD-10-CM

## 2020-10-25 DIAGNOSIS — E669 Obesity, unspecified: Secondary | ICD-10-CM

## 2020-10-25 DIAGNOSIS — E1169 Type 2 diabetes mellitus with other specified complication: Secondary | ICD-10-CM | POA: Diagnosis not present

## 2020-10-25 DIAGNOSIS — Z0001 Encounter for general adult medical examination with abnormal findings: Secondary | ICD-10-CM

## 2020-10-25 DIAGNOSIS — E785 Hyperlipidemia, unspecified: Secondary | ICD-10-CM

## 2020-10-25 DIAGNOSIS — E1165 Type 2 diabetes mellitus with hyperglycemia: Secondary | ICD-10-CM | POA: Diagnosis not present

## 2020-10-25 DIAGNOSIS — E1159 Type 2 diabetes mellitus with other circulatory complications: Secondary | ICD-10-CM | POA: Diagnosis not present

## 2020-10-25 DIAGNOSIS — I152 Hypertension secondary to endocrine disorders: Secondary | ICD-10-CM

## 2020-10-25 NOTE — Assessment & Plan Note (Signed)
Check labs. Continue lipitor 40mg  daily.

## 2020-10-25 NOTE — Patient Instructions (Signed)
It was very nice to see you today!  We will check blood work today.  We will see you back in 3 to 6 months pending on your blood work.  Keep an eye on your BP and let know if it is persistently elevated.   Will be due July physical in 1 year.  Come back to see [place] sooner if needed.  Take care, Dr [name]  PLEASE NOTE:  If you had any lab tests please let us know if you have not heard back within a few days. You may see your results on mychart before we have a chance to review them but we will give you a call once they are reviewed by [place]. If we ordered any referrals today, please let us know if you have not heard from their office within the next week.   Please try these tips to maintain a healthy lifestyle:  Eat at least 3 REAL meals and 1-2 snacks per day.  Aim for no more than 5 hours between eating.  If you eat breakfast, please do so within one hour of getting up.   Each meal should contain half fruits/vegetables, one quarter protein, and one quarter carbs (no bigger than a computer mouse)  Cut down on sweet beverages. This includes juice, soda, and sweet tea.   Drink at least 1 glass of water with each meal and aim for at least 8 glasses per day  Exercise at least 150 minutes every week.    Preventive Care 65-47 Years Old, Male Preventive care refers to lifestyle choices and visits with your health care provider that can promote health and wellness. This includes: A yearly physical exam. This is also called an annual wellness visit. Regular dental and eye exams. Immunizations. Screening for certain conditions. Healthy lifestyle choices, such as: Eating a healthy diet. Getting regular exercise. Not using drugs or products that contain nicotine and tobacco. Limiting alcohol use. What can I expect for my preventive care visit? Physical exam Your health care provider will check your: Height and weight. These may be used to calculate your BMI (body mass index). BMI is a  measurement that tells if you are at a healthy weight. Heart rate and blood pressure. Body temperature. Skin for abnormal spots. Counseling Your health care provider may ask you questions about your: Past medical problems. Family's medical history. Alcohol, tobacco, and drug use. Emotional well-being. Home life and relationship well-being. Sexual activity. Diet, exercise, and sleep habits. Work and work Astronomer. Access to firearms. What immunizations do I need? Vaccines are usually given at various ages, according to a schedule. Your health care provider will recommend vaccines for you based on your age, medical history, and lifestyle or other factors, such as travel or where you work. What tests do I need? Blood tests Lipid and cholesterol levels. These may be checked every 5 years, or more often if you are over 18 years old. Hepatitis C test. Hepatitis B test. Screening Lung cancer screening. You may have this screening every year starting at age 49 if you have a 30-pack-year history of smoking and currently smoke or have quit within the past 15 years. Prostate cancer screening. Recommendations will vary depending on your family history and other risks. Genital exam to check for testicular cancer or hernias. Colorectal cancer screening. All adults should have this screening starting at age 23 and continuing until age 29. Your health care provider may recommend screening at age 61 if you are at increased risk. You  will have tests every 1-10 years, depending on your results and the type of screening test. Diabetes screening. This is done by checking your blood sugar (glucose) after you have not eaten for a while (fasting). You may have this done every 1-3 years. STD (sexually transmitted disease) testing, if you are at risk. Follow these instructions at home: Eating and drinking  Eat a diet that includes fresh fruits and vegetables, whole grains, lean protein, and low-fat  dairy products. Take vitamin and mineral supplements as recommended by your health care provider. Do not drink alcohol if your health care provider tells you not to drink. If you drink alcohol: Limit how much you have to 0-2 drinks a day. Be aware of how much alcohol is in your drink. In the [place] one drink equals one 12 oz bottle of beer (355 mL), one 5 oz glass of wine (148 mL), or one 1 oz glass of hard liquor (44 mL). Lifestyle Take daily care of your teeth and gums. Brush your teeth every morning and night with fluoride toothpaste. Floss one time each day. Stay active. Exercise for at least 30 minutes 5 or more days each week. Do not use any products that contain nicotine or tobacco, such as cigarettes, e-cigarettes, and chewing tobacco. If you need help quitting, ask your health care provider. Do not use drugs. If you are sexually active, practice safe sex. Use a condom or other form of protection to prevent STIs (sexually transmitted infections). If told by your health care provider, take low-dose aspirin daily starting at age 25. Find healthy ways to cope with stress, such as: Meditation, yoga, or listening to music. Journaling. Talking to a trusted person. Spending time with friends and family. Safety Always wear your seat belt while driving or riding in a vehicle. Do not drive: If you have been drinking alcohol. Do not ride with someone who has been drinking. When you are tired or distracted. While texting. Wear a helmet and other protective equipment during sports activities. If you have firearms in your house, make sure you follow all gun safety procedures. What's next? Go to your health care provider once a year for an annual wellness visit. Ask your health care provider how often you should have your eyes and teeth checked. Stay up to date on all vaccines. This information is not intended to replace advice given to you by your health care provider. Make sure you discuss  any questions you have with your health care provider. Document Revised: 03/05/2020 Document Reviewed: 12/20/2017 Elsevier Patient Education  2022 [employer].

## 2020-10-25 NOTE — Assessment & Plan Note (Signed)
He has been off of Ozempic for the last couple weeks.  We will check A1c.  His last A1c was 5.7.  May need to restart Ozempic depending on results of A1c.

## 2020-10-25 NOTE — Assessment & Plan Note (Signed)
Slightly above goal.  Has previously been well controlled.  Continue current dose amlodipine 5 mg daily.  We will continue monitoring at home and let us know if not improving.

## 2020-10-25 NOTE — Progress Notes (Signed)
Chief Complaint:  Clinton Church is a 55 y.o. male who presents today for his annual comprehensive physical exam.    Assessment/Plan:  Chronic Problems Addressed Today: Dyslipidemia associated with type 2 diabetes mellitus (HCC) Check labs. Continue lipitor 40mg  daily.   Hypertension associated with diabetes (HCC) Slightly above goal.  Has previously been well controlled.  Continue current dose amlodipine 5 mg daily.  We will continue monitoring at home and let know if not improving.   Type 2 diabetes mellitus with hyperglycemia (HCC) He has been off of Ozempic for the last couple weeks.  We will check A1c.  His last A1c was 5.7.  May need to restart Ozempic depending on results of A1c.   Body mass index is 30.16 kg/m. / Obese  BMI Metric Follow Up - 10/25/20 1529       BMI Metric Follow Up-Please document annually   BMI Metric Follow Up Education provided              Preventative Healthcare: Check Labs. UTD on colon cancer screening.   Patient Counseling(The following topics were reviewed and/or handout was given):  -Nutrition: Stressed importance of moderation in sodium/caffeine intake, saturated fat and cholesterol, caloric balance, sufficient intake of fresh fruits, vegetables, and fiber.  -Stressed the importance of regular exercise.   -Substance Abuse: Discussed cessation/primary prevention of tobacco, alcohol, or other drug use; driving or other dangerous activities under the influence; availability of treatment for abuse.   -Injury prevention: Discussed safety belts, safety helmets, smoke detector, smoking near bedding or upholstery.   -Sexuality: Discussed sexually transmitted diseases, partner selection, use of condoms, avoidance of unintended pregnancy and contraceptive alternatives.   -Dental health: Discussed importance of regular tooth brushing, flossing, and dental visits.  -Health maintenance and immunizations reviewed. Please refer to Health  maintenance section.  Return to care in 1 year for next preventative visit.     Subjective:  HPI:  He has no acute complaints today.   He states that in regards to his weight gain he has been working more which has been influencing his diet, as well as the fact that he was unable to pick up his prescribed medication. This was due to him not wanting to pay the extra 20-30 dollars from his previous pharmacy and switched to a different one which is currently not fulfilling the prescription. Because of this he has been out of his medication for ~1 month.  His knee has been doing better, but does have some occasional issues with turning, and puts a brace on it every once in a while.  He states that he did take his blood pressure medication before coming into the visit today.  BP Readings from Last 3 Encounters:  10/25/20 (!) 156/97  05/31/20 138/89  04/09/20 (!) 168/104    Lifestyle Diet: Recognizes that he can improve through carb reduction and incorporation of more fruits and vegetables  Exercise: Has been doing less exercise due to heat, but will incorporate more as the weather cools.  Depression screen PHQ 2/9 10/25/2020  Decreased Interest 0  Down, Depressed, Hopeless 0  PHQ - 2 Score 0  Altered sleeping -  Tired, decreased energy -  Change in appetite -  Feeling bad or failure about yourself  -  Trouble concentrating -  Moving slowly or fidgety/restless -  Suicidal thoughts -  PHQ-9 Score -    Health Maintenance Due  Topic Date Due   FOOT EXAM  Never done  OPHTHALMOLOGY EXAM  Never done   URINE MICROALBUMIN  Never done   HIV Screening  Never done   Hepatitis C Screening  Never done   Zoster Vaccines- Shingrix (1 of 2) Never done     ROS: Per HPI, otherwise a complete review of systems was negative.   PMH:  The following were reviewed and entered/updated in epic: Past Medical History:  Diagnosis Date   Hypercholesterolemia    Hypertension    Patient Active  Problem List   Diagnosis Date Noted   Left knee pain 02/16/2020   Erectile dysfunction 08/23/2018   Type 2 diabetes mellitus with hyperglycemia (HCC) 03/19/2018   Hypertension associated with diabetes (HCC) 03/19/2018   Dyslipidemia associated with type 2 diabetes mellitus (HCC) 03/19/2018   History reviewed. No pertinent surgical history.  Family History  Problem Relation Age of Onset   Diabetes Mother    Lung cancer Father    Prostate cancer Neg Hx    Colon cancer Neg Hx     Medications- reviewed and updated Current Outpatient Medications  Medication Sig Dispense Refill   amLODipine (NORVASC) 5 MG tablet Take 1 tablet (5 mg total) by mouth daily. 90 tablet 3   atorvastatin (LIPITOR) 40 MG tablet Take 1 tablet (40 mg total) by mouth daily. 90 tablet 1   Semaglutide,0.25 or 0.5MG /DOS, (OZEMPIC, 0.25 OR 0.5 MG/DOSE,) 2 MG/1.5ML SOPN Inject 0.5 mg into the skin once a week. 1.5 mL 3   No current facility-administered medications for this visit.    Allergies-reviewed and updated No Known Allergies  Social History   Socioeconomic History   Marital status: Married    Spouse name: Not on file   Number of children: Not on file   Years of education: Not on file   Highest education level: Not on file  Occupational History   Not on file  Tobacco Use   Smoking status: Never   Smokeless tobacco: Never  Substance and Sexual Activity   Alcohol use: No   Drug use: No   Sexual activity: Not on file  Other Topics Concern   Not on file  Social History Narrative   Not on file   Social Determinants of Health   Financial Resource Strain: Not on file  Food Insecurity: Not on file  Transportation Needs: Not on file  Physical Activity: Not on file  Stress: Not on file  Social Connections: Not on file        Objective:  Physical Exam: BP (!) 156/97   Pulse 77   Temp 98.8 F (37.1 C)   Ht 5\' 9"  (1.753 m)   Wt 204 lb 4 oz (92.6 kg)   SpO2 98%   BMI 30.16 kg/m   Body  mass index is 30.16 kg/m. Wt Readings from Last 3 Encounters:  10/25/20 204 lb 4 oz (92.6 kg)  05/31/20 193 lb 3.2 oz (87.6 kg)  04/09/20 194 lb 3.2 oz (88.1 kg)   Gen: NAD, resting comfortably HEENT: TMs normal bilaterally. OP clear. No thyromegaly noted.  CV: RRR with no murmurs appreciated Pulm: NWOB, CTAB with no crackles, wheezes, or rhonchi GI: Normal bowel sounds present. Soft, Nontender, Nondistended. MSK: no edema, cyanosis, or clubbing noted Skin: warm, dry Neuro: CN2-12 grossly intact. Strength 5/5 in upper and lower extremities. Reflexes symmetric and intact bilaterally.  Psych: Normal affect and thought content     I,Jordan Kelly,acting as a scribe for 06/09/20, MD.,have documented all relevant documentation on the behalf of Jacquiline Doe,  MD,as directed by  Jacquiline Doe, MD while in the presence of Jacquiline Doe, MD.  I, Jacquiline Doe, MD, have reviewed all documentation for this visit. The documentation on 10/25/20 for the exam, diagnosis, procedures, and orders are all accurate and complete.  Katina Degree. [name], MD 10/25/2020 3:29 PM

## 2020-10-26 LAB — LIPID PANEL
Cholesterol: 165 mg/dL (ref 0–200)
HDL: 50.4 mg/dL (ref >39.00)
LDL Cholesterol: 100 mg/dL — ABNORMAL HIGH (ref 0–99)
NonHDL: 114.96
Total CHOL/HDL Ratio: 3
Triglycerides: 76 mg/dL (ref 0.0–149.0)
VLDL: 15.2 mg/dL (ref 0.0–40.0)

## 2020-10-26 LAB — TSH: TSH: 1.35 u[IU]/mL (ref 0.35–5.50)

## 2020-10-26 LAB — COMPREHENSIVE METABOLIC PANEL
ALT: 35 U/L (ref 0–53)
AST: 27 U/L (ref 0–37)
Albumin: 4.8 g/dL (ref 3.5–5.2)
Alkaline Phosphatase: 64 U/L (ref 39–117)
BUN: 15 mg/dL (ref 6–23)
CO2: 28 mEq/L (ref 19–32)
Calcium: 10 mg/dL (ref 8.4–10.5)
Chloride: 103 mEq/L (ref 96–112)
Creatinine, Ser: 1.1 mg/dL (ref 0.40–1.50)
GFR: 76.37 mL/min (ref >60.00)
Glucose, Bld: 118 mg/dL — ABNORMAL HIGH (ref 70–99)
Potassium: 4.2 mEq/L (ref 3.5–5.1)
Sodium: 140 mEq/L (ref 135–145)
Total Bilirubin: 0.9 mg/dL (ref 0.2–1.2)
Total Protein: 7.4 g/dL (ref 6.0–8.3)

## 2020-10-26 LAB — CBC
HCT: 45.9 % (ref 39.0–52.0)
Hemoglobin: 15.2 g/dL (ref 13.0–17.0)
MCHC: 33.1 g/dL (ref 30.0–36.0)
MCV: 87.1 fl (ref 78.0–100.0)
Platelets: 172 10*3/uL (ref 150.0–400.0)
RBC: 5.27 Mil/uL (ref 4.22–5.81)
RDW: 14.2 % (ref 11.5–15.5)
WBC: 5.8 10*3/uL (ref 4.0–10.5)

## 2020-10-26 LAB — MICROALBUMIN / CREATININE URINE RATIO
Creatinine,U: 64.1 mg/dL
Microalb Creat Ratio: 1.1 mg/g (ref 0.0–30.0)
Microalb, Ur: <0.7 mg/dL (ref 0.0–1.9)

## 2020-10-26 LAB — HEMOGLOBIN A1C: Hgb A1c MFr Bld: 6.4 % (ref 4.6–6.5)

## 2020-10-26 LAB — PSA: PSA: 1.67 ng/mL (ref 0.10–4.00)

## 2020-10-27 NOTE — Progress Notes (Signed)
Please inform patient of the following:  His A1c is up a bit since last time but still within reasonable goal. We do not need to restart any medications but he should work hard on diet and exercise. I would like to see him back in 3 months to recheck.   All of his other labs are stable.

## 2021-02-10 ENCOUNTER — Other Ambulatory Visit: Payer: Self-pay | Admitting: Family Medicine

## 2021-04-25 ENCOUNTER — Ambulatory Visit: Payer: Federal, State, Local not specified - PPO | Admitting: Family Medicine

## 2021-04-25 VITALS — BP 162/102 | HR 77 | Temp 98.1°F | Ht 69.0 in | Wt 210.0 lb

## 2021-04-25 DIAGNOSIS — E1165 Type 2 diabetes mellitus with hyperglycemia: Secondary | ICD-10-CM

## 2021-04-25 DIAGNOSIS — E1159 Type 2 diabetes mellitus with other circulatory complications: Secondary | ICD-10-CM

## 2021-04-25 DIAGNOSIS — I152 Hypertension secondary to endocrine disorders: Secondary | ICD-10-CM | POA: Diagnosis not present

## 2021-04-25 DIAGNOSIS — I8390 Asymptomatic varicose veins of unspecified lower extremity: Secondary | ICD-10-CM

## 2021-04-25 LAB — POCT GLYCOSYLATED HEMOGLOBIN (HGB A1C): Hemoglobin A1C: 7.7 % — AB (ref 4.0–5.6)

## 2021-04-25 NOTE — Assessment & Plan Note (Signed)
A1c up to 7.7.  He has made several dietary discretions recently though is trying to work on carving this over the last few weeks.  We discussed restarting Ozempic versus continuing lifestyle modifications for another few months.  He would like to defer medications for now.  We will recheck again in 3 months.  If A1c is trending up we will need to restart Ozempic. ?

## 2021-04-25 NOTE — Assessment & Plan Note (Signed)
Above goal.  He has not taken blood pressure medication for last couple of days.  Patient states his home readings are typically in the 130s over 80s.  No chest pain or shortness of breath.  We will continue home monitoring and he send me a mychart message in a few weeks with his home readings. ?

## 2021-04-25 NOTE — Progress Notes (Signed)
? ?  Clinton Church is a 56 y.o. male who presents today for an office visit. ? ?Assessment/Plan:  ?Chronic Problems Addressed Today: ?Type 2 diabetes mellitus with hyperglycemia (HCC) ?A1c up to 7.7.  He has made several dietary discretions recently though is trying to work on carving this over the last few weeks.  We discussed restarting Ozempic versus continuing lifestyle modifications for another few months.  He would like to defer medications for now.  We will recheck again in 3 months.  If A1c is trending up we will need to restart Ozempic. ? ?Hypertension associated with diabetes (HCC) ?Above goal.  He has not taken blood pressure medication for last couple of days.  Patient states his home readings are typically in the 130s over 80s.  No chest pain or shortness of breath.  We will continue home monitoring and he send me a mychart message in a few weeks with his home readings. ? ?Varicosities of leg ?Longstanding history that seems to be worsening recently.  Now having some pain and tingling in the area as well.  We will place referral to vascular surgery for further management evaluation. ? ? ?  ?Subjective:  ?HPI: ? ?See A/p for status of chronic conditions.    ? ?   ?  ?Objective:  ?Physical Exam: ?BP (!) 172/108 (BP Location: Left Arm)   Pulse 77   Temp 98.1 ?F (36.7 ?C) (Temporal)   Ht 5\' 9"  (1.753 m)   Wt 210 lb (95.3 kg)   SpO2 98%   BMI 31.01 kg/m?   ?Gen: No acute distress, resting comfortably ?CV: Regular rate and rhythm with no murmurs appreciated ?Pulm: Normal work of breathing, clear to auscultation bilaterally with no crackles, wheezes, or rhonchi ?MSK: Several large varicosities noted on left lower extremity. ?Neuro: Grossly normal, moves all extremities ?Psych: Normal affect and thought content ? ?   ? ? . Katina Degree, MD ?04/25/2021 10:09 AM  ?

## 2021-04-25 NOTE — Patient Instructions (Signed)
It was very nice to see you today! ? ?Please keep an eye on your blood pressure and let me know if it is persistently elevated to 140/90 or higher. ? ?I will refer you to see a vascular surgeon for the varicose veins in your legs. ? ?Please continue to work on diet and exercise.  Please try to avoid sugar and salt. ? ?I will see you back in 3 months to recheck your A1c.  Come back sooner if needed. ? ?Take care, ?Dr [name] ? ?PLEASE NOTE: ? ?If you had any lab tests please let us know if you have not heard back within a few days. You may see your results on mychart before we have a chance to review them but we will give you a call once they are reviewed by [place]. If we ordered any referrals today, please let us know if you have not heard from their office within the next week.  ? ?Please try these tips to maintain a healthy lifestyle: ? ?Eat at least 3 REAL meals and 1-2 snacks per day.  Aim for no more than 5 hours between eating.  If you eat breakfast, please do so within one hour of getting up.  ? ?Each meal should contain half fruits/vegetables, one quarter protein, and one quarter carbs (no bigger than a computer mouse) ? ?Cut down on sweet beverages. This includes juice, soda, and sweet tea.  ? ?Drink at least 1 glass of water with each meal and aim for at least 8 glasses per day ? ?Exercise at least 150 minutes every week.   ?

## 2021-04-25 NOTE — Assessment & Plan Note (Signed)
Longstanding history that seems to be worsening recently.  Now having some pain and tingling in the area as well.  We will place referral to vascular surgery for further management evaluation. ?

## 2021-06-10 ENCOUNTER — Telehealth: Payer: Federal, State, Local not specified - PPO | Admitting: Physician Assistant

## 2021-06-10 DIAGNOSIS — B9689 Other specified bacterial agents as the cause of diseases classified elsewhere: Secondary | ICD-10-CM | POA: Diagnosis not present

## 2021-06-10 DIAGNOSIS — J208 Acute bronchitis due to other specified organisms: Secondary | ICD-10-CM

## 2021-06-10 MED ORDER — AZITHROMYCIN 250 MG PO TABS
ORAL_TABLET | ORAL | 0 refills | Status: AC
Start: 2021-06-10 — End: 2021-06-15

## 2021-06-10 MED ORDER — BENZONATATE 100 MG PO CAPS: 100.0000 mg | ORAL_CAPSULE | Freq: Three times a day (TID) | ORAL | 0 refills | Status: DC | PRN

## 2021-06-10 NOTE — Progress Notes (Signed)
We are sorry that you are not feeling well.  Here is how we plan to help!  Based on your presentation I believe you most likely have A cough due to bacteria.  When patients have a fever and a productive cough with a change in color or increased sputum production, we are concerned about bacterial bronchitis.  If left untreated it can progress to pneumonia.  If your symptoms do not improve with your treatment plan it is important that you contact your provider.   I have prescribed Azithromyin 250 mg: two tablets now and then one tablet daily for 4 additonal days    In addition you may use A non-prescription cough medication called Mucinex DM: take 2 tablets every 12 hours. and A prescription cough medication called Tessalon Perles 100mg. You may take 1-2 capsules every 8 hours as needed for your cough.   From your responses in the eVisit questionnaire you describe inflammation in the upper respiratory tract which is causing a significant cough.  This is commonly called Bronchitis and has four common causes:   Allergies Viral Infections Acid Reflux Bacterial Infection Allergies, viruses and acid reflux are treated by controlling symptoms or eliminating the cause. An example might be a cough caused by taking certain blood pressure medications. You stop the cough by changing the medication. Another example might be a cough caused by acid reflux. Controlling the reflux helps control the cough.  USE OF BRONCHODILATOR ("RESCUE") INHALERS: There is a risk from using your bronchodilator too frequently.  The risk is that over-reliance on a medication which only relaxes the muscles surrounding the breathing tubes can reduce the effectiveness of medications prescribed to reduce swelling and congestion of the tubes themselves.  Although you feel brief relief from the bronchodilator inhaler, your asthma may actually be worsening with the tubes becoming more swollen and filled with mucus.  This can delay other  crucial treatments, such as oral steroid medications. If you need to use a bronchodilator inhaler daily, several times per day, you should discuss this with your provider.  There are probably better treatments that could be used to keep your asthma under control.     HOME CARE Only take medications as instructed by your medical team. Complete the entire course of an antibiotic. Drink plenty of fluids and get plenty of rest. Avoid close contacts especially the very young and the elderly Cover your mouth if you cough or cough into your sleeve. Always remember to wash your hands A steam or ultrasonic humidifier can help congestion.   GET HELP RIGHT AWAY IF: You develop worsening fever. You become short of breath You cough up blood. Your symptoms persist after you have completed your treatment plan MAKE SURE YOU  Understand these instructions. Will watch your condition. Will get help right away if you are not doing well or get worse.    Thank you for choosing an e-visit.  Your e-visit answers were reviewed by a board certified advanced clinical practitioner to complete your personal care plan. Depending upon the condition, your plan could have included both over the counter or prescription medications.  Please review your pharmacy choice. Make sure the pharmacy is open so you can pick up prescription now. If there is a problem, you may contact your provider through MyChart messaging and have the prescription routed to another pharmacy.  Your safety is important to us. If you have drug allergies check your prescription carefully.   For the next 24 hours you can use   MyChart to ask questions about today's visit, request a non-urgent call back, or ask for a work or school excuse. You will get an email in the next two days asking about your experience. I hope that your e-visit has been valuable and will speed your recovery.  I provided 5 minutes of non face-to-face time during this encounter  for chart review and documentation.   

## 2021-07-01 ENCOUNTER — Other Ambulatory Visit: Payer: Self-pay | Admitting: *Deleted

## 2021-07-01 DIAGNOSIS — I8390 Asymptomatic varicose veins of unspecified lower extremity: Secondary | ICD-10-CM

## 2021-07-20 ENCOUNTER — Ambulatory Visit: Payer: Federal, State, Local not specified - PPO | Admitting: Physician Assistant

## 2021-07-20 ENCOUNTER — Ambulatory Visit (HOSPITAL_COMMUNITY)
Admission: RE | Admit: 2021-07-20 | Discharge: 2021-07-20 | Disposition: A | Discharge status: Home / Self Care | Payer: Federal, State, Local not specified - PPO | Source: Ambulatory Visit | Attending: Physician Assistant | Admitting: Physician Assistant

## 2021-07-20 VITALS — BP 161/110 | HR 80 | Temp 98.0°F | Resp 20 | Ht 69.0 in | Wt 197.1 lb

## 2021-07-20 DIAGNOSIS — I8390 Asymptomatic varicose veins of unspecified lower extremity: Secondary | ICD-10-CM

## 2021-07-20 DIAGNOSIS — I872 Venous insufficiency (chronic) (peripheral): Secondary | ICD-10-CM

## 2021-07-20 DIAGNOSIS — I8393 Asymptomatic varicose veins of bilateral lower extremities: Secondary | ICD-10-CM

## 2021-07-20 NOTE — Progress Notes (Signed)
Office Note     CC:  follow up Requesting Provider:  Ardith Dark, MD  HPI: Clinton Church is a 56 y.o. 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  He works as a Garment/textile technologist and has noticed an increase in prominence and symptoms from his varicose veins in his medial left leg.  He denies any history of DVT venous ulcerations, trauma, or prior vascular interventions.  He has occasional painful episodes and areas of clustered varicose veins.  He does not wear compression or elevate his legs during the day.  He is not a tobacco user.   Past Medical History:  Diagnosis Date   Hypercholesterolemia    Hypertension     History reviewed. No pertinent surgical history.  Social History   Socioeconomic History   Marital status: Married    Spouse name: Not on file   Number of children: Not on file   Years of education: Not on file   Highest education level: Not on file  Occupational History   Not on file  Tobacco Use   Smoking status: Never    Passive exposure: Never   Smokeless tobacco: Never  Substance and Sexual Activity   Alcohol use: No   Drug use: No   Sexual activity: Not on file  Other Topics Concern   Not on file  Social History Narrative   Not on file   Social Determinants of Health   Financial Resource Strain: Not on file  Food Insecurity: Not on file  Transportation Needs: Not on file  Physical Activity: Not on file  Stress: Not on file  Social Connections: Not on file  Intimate Partner Violence: Not on file    Family History  Problem Relation Age of Onset   Diabetes Mother    Lung cancer Father    Prostate cancer Neg Hx    Colon cancer Neg Hx     Current Outpatient Medications  Medication Sig Dispense Refill   amLODipine (NORVASC) 5 MG tablet Take 1 tablet (5 mg total) by mouth daily. 90 tablet 3   atorvastatin (LIPITOR) 40 MG tablet TAKE 1 TABLET BY MOUTH EVERY DAY 90 tablet 1    benzonatate (TESSALON) 100 MG capsule Take 1 capsule (100 mg total) by mouth 3 (three) times daily as needed. (Patient not taking: Reported on 07/20/2021) 30 capsule 0   Semaglutide,0.25 or 0.5MG /DOS, (OZEMPIC, 0.25 OR 0.5 MG/DOSE,) 2 MG/1.5ML SOPN Inject 0.5 mg into the skin once a week. (Patient not taking: Reported on 04/25/2021) 1.5 mL 3   No current facility-administered medications for this visit.    No Known Allergies   REVIEW OF SYSTEMS:   [X]  denotes positive finding, [ ]  denotes negative finding Cardiac  Comments:  Chest pain or chest pressure:    Shortness of breath upon exertion:    Short of breath when lying flat:    Irregular heart rhythm:        Vascular    Pain in calf, thigh, or hip brought on by ambulation:    Pain in feet at night that wakes you up from your sleep:     Blood clot in your veins:    Leg swelling:         Pulmonary    Oxygen at home:    Productive cough:     Wheezing:         Neurologic    Sudden weakness in arms or legs:  Sudden numbness in arms or legs:     Sudden onset of difficulty speaking or slurred speech:    Temporary loss of vision in one eye:     Problems with dizziness:         Gastrointestinal    Blood in stool:     Vomited blood:         Genitourinary    Burning when urinating:     Blood in urine:        Psychiatric    Major depression:         Hematologic    Bleeding problems:    Problems with blood clotting too easily:        Skin    Rashes or ulcers:        Constitutional    Fever or chills:      PHYSICAL EXAMINATION:  Vitals:   07/20/21 1453  BP: (!) 161/110  Pulse: 80  Resp: 20  Temp: 98 F (36.7 C)  TempSrc: Temporal  SpO2: 97%  Weight: 197 lb 1.6 oz (89.4 kg)  Height: 5\' 9"  (1.753 m)    General:  WDWN in NAD; vital signs documented above Gait: Not observed HENT: WNL, normocephalic Pulmonary: normal non-labored breathing , without Rales, rhonchi,  wheezing Cardiac: regular HR Abdomen:  soft, NT, no masses Skin: without rashes Vascular Exam/Pulses:  Right Left  DP 2+ (normal) 2+ (normal)   Extremities: without ischemic changes, without Gangrene , without cellulitis; without open wounds; large clustered areas of varicosities on his medial left leg pictured below Musculoskeletal: no muscle wasting or atrophy  Neurologic: A&O X 3;  No focal weakness or paresthesias are detected Psychiatric:  The pt has Normal affect.      Non-Invasive Vascular Imaging:   Left lower extremity venous reflux study Negative for DVT Deep reflux in the common femoral and femoral vein Incompetent greater saphenous vein especially in the saphenofemoral junction down to the mid thigh Incompetent accessory saphenous vein    ASSESSMENT/PLAN:: 56 y.o. male here for evaluation of symptomatic varicose veins of the left lower extremity  -Varicose vein clusters of the left medial leg have become increasingly symptomatic and patient is experiencing what sounds like episodes of thrombophlebitis -Left lower extremity venous reflux study is negative for DVT and demonstrated deep venous reflux in the common femoral and femoral veins as well as an incompetent greater saphenous vein -Patient was fitted and purchased thigh-high compression to be worn regularly.  Recommendations also include periodic elevation of his legs above the level of his heart.  He will also try to avoid prolonged sitting and standing as able.  We discussed using NSAIDs and a warm compress the next time he experiences pain and inflammation to these areas of clustered varicose veins.  He will follow-up in 3 months for further evaluation with a vein surgeon to see if he would be a candidate for laser ablation therapy and/or stab phlebectomy.  He will call/return office sooner with any questions or concerns.   58, PA-C [facility] [phone]  Clinic MD:   [phone]

## 2021-07-25 ENCOUNTER — Ambulatory Visit: Payer: Federal, State, Local not specified - PPO | Admitting: Family Medicine

## 2021-09-02 ENCOUNTER — Other Ambulatory Visit: Payer: Self-pay | Admitting: *Deleted

## 2021-09-02 MED ORDER — ATORVASTATIN CALCIUM 40 MG PO TABS: 40.0000 mg | ORAL_TABLET | Freq: Every day | ORAL | 1 refills | Status: AC

## 2021-10-03 ENCOUNTER — Encounter: Payer: Self-pay | Admitting: *Deleted

## 2021-10-26 ENCOUNTER — Ambulatory Visit: Payer: Federal, State, Local not specified - PPO | Admitting: Vascular Surgery

## 2021-12-22 ENCOUNTER — Encounter: Payer: Self-pay | Admitting: *Deleted

## 2022-03-13 ENCOUNTER — Encounter: Payer: Self-pay | Admitting: Pharmacist

## 2022-03-22 ENCOUNTER — Encounter: Payer: Self-pay | Admitting: Family Medicine

## 2022-03-22 ENCOUNTER — Ambulatory Visit (INDEPENDENT_AMBULATORY_CARE_PROVIDER_SITE_OTHER): Payer: Federal, State, Local not specified - PPO | Admitting: Family Medicine

## 2022-03-22 VITALS — BP 152/89 | HR 63 | Temp 98.6°F | Ht 69.0 in | Wt 189.8 lb

## 2022-03-22 DIAGNOSIS — E1169 Type 2 diabetes mellitus with other specified complication: Secondary | ICD-10-CM

## 2022-03-22 DIAGNOSIS — E1159 Type 2 diabetes mellitus with other circulatory complications: Secondary | ICD-10-CM

## 2022-03-22 DIAGNOSIS — E1165 Type 2 diabetes mellitus with hyperglycemia: Secondary | ICD-10-CM | POA: Diagnosis not present

## 2022-03-22 DIAGNOSIS — I152 Hypertension secondary to endocrine disorders: Secondary | ICD-10-CM

## 2022-03-22 DIAGNOSIS — Z0001 Encounter for general adult medical examination with abnormal findings: Secondary | ICD-10-CM | POA: Diagnosis not present

## 2022-03-22 DIAGNOSIS — R350 Frequency of micturition: Secondary | ICD-10-CM | POA: Diagnosis not present

## 2022-03-22 DIAGNOSIS — Z125 Encounter for screening for malignant neoplasm of prostate: Secondary | ICD-10-CM | POA: Diagnosis not present

## 2022-03-22 DIAGNOSIS — E785 Hyperlipidemia, unspecified: Secondary | ICD-10-CM | POA: Diagnosis not present

## 2022-03-22 DIAGNOSIS — Z23 Encounter for immunization: Secondary | ICD-10-CM

## 2022-03-22 LAB — CBC
HCT: 43.5 % (ref 39.0–52.0)
Hemoglobin: 14.7 g/dL (ref 13.0–17.0)
MCHC: 33.8 g/dL (ref 30.0–36.0)
MCV: 86.6 fl (ref 78.0–100.0)
Platelets: 182 10*3/uL (ref 150.0–400.0)
RBC: 5.03 Mil/uL (ref 4.22–5.81)
RDW: 13.8 % (ref 11.5–15.5)
WBC: 4.7 10*3/uL (ref 4.0–10.5)

## 2022-03-22 LAB — URINALYSIS, ROUTINE W REFLEX MICROSCOPIC
Bilirubin Urine: NEGATIVE
Hgb urine dipstick: NEGATIVE
Ketones, ur: 15 — AB
Leukocytes,Ua: NEGATIVE
Nitrite: NEGATIVE
RBC / HPF: NONE SEEN (ref 0–?)
Specific Gravity, Urine: 1.015 (ref 1.000–1.030)
Total Protein, Urine: NEGATIVE
Urine Glucose: >=1000 — AB
Urobilinogen, UA: 0.2 (ref 0.0–1.0)
pH: 6 (ref 5.0–8.0)

## 2022-03-22 LAB — COMPREHENSIVE METABOLIC PANEL
ALT: 21 U/L (ref 0–53)
AST: 19 U/L (ref 0–37)
Albumin: 4.2 g/dL (ref 3.5–5.2)
Alkaline Phosphatase: 67 U/L (ref 39–117)
BUN: 22 mg/dL (ref 6–23)
CO2: 29 mEq/L (ref 19–32)
Calcium: 9.5 mg/dL (ref 8.4–10.5)
Chloride: 101 mEq/L (ref 96–112)
Creatinine, Ser: 1.05 mg/dL (ref 0.40–1.50)
GFR: 79.96 mL/min (ref >60.00)
Glucose, Bld: 285 mg/dL — ABNORMAL HIGH (ref 70–99)
Potassium: 4 mEq/L (ref 3.5–5.1)
Sodium: 139 mEq/L (ref 135–145)
Total Bilirubin: 0.8 mg/dL (ref 0.2–1.2)
Total Protein: 6.6 g/dL (ref 6.0–8.3)

## 2022-03-22 LAB — LIPID PANEL
Cholesterol: 141 mg/dL (ref 0–200)
HDL: 45.9 mg/dL (ref >39.00)
LDL Cholesterol: 70 mg/dL (ref 0–99)
NonHDL: 94.8
Total CHOL/HDL Ratio: 3
Triglycerides: 123 mg/dL (ref 0.0–149.0)
VLDL: 24.6 mg/dL (ref 0.0–40.0)

## 2022-03-22 LAB — HEMOGLOBIN A1C: Hgb A1c MFr Bld: 15.5 % — ABNORMAL HIGH (ref 4.6–6.5)

## 2022-03-22 LAB — TSH: TSH: 0.9 u[IU]/mL (ref 0.35–5.50)

## 2022-03-22 LAB — PSA: PSA: 2.12 ng/mL (ref 0.10–4.00)

## 2022-03-22 LAB — MICROALBUMIN / CREATININE URINE RATIO
Creatinine,U: 53.4 mg/dL
Microalb Creat Ratio: 2.8 mg/g (ref 0.0–30.0)
Microalb, Ur: 1.5 mg/dL (ref 0.0–1.9)

## 2022-03-22 NOTE — Addendum Note (Signed)
Addended by: Betti Cruz on: 03/22/2022 10:51 AM   Modules accepted: Orders

## 2022-03-22 NOTE — Assessment & Plan Note (Addendum)
Mildly elevated today.  Home readings typically at range.  Continue Norvasc 5 mg daily.  He will continue to monitor at home.  He will let us know if persistently elevated.  Follow-up again at next office visit.  May need to increase dose of Norvasc if not controlled.

## 2022-03-22 NOTE — Assessment & Plan Note (Signed)
On Lipitor 40 mg daily.  Check lipids. 

## 2022-03-22 NOTE — Progress Notes (Signed)
Chief Complaint:  Clinton Church is a 57 y.o. male who presents today for his annual comprehensive physical exam.    Assessment/Plan:  New/Acute Problems: Grief Recently lost sister. Overall feels like he is managing reasonably well. Has good support at home. Discussed referral to behavorial health for grief canceling however he declined for now.   Urinary frequency Likely secondary to hyperglycemia though we will confirm with labs, UA, and urine culture.  Potentially may have some underlying BPH though we will evaluate for above first.  Chronic Problems Addressed Today: Type 2 diabetes mellitus with hyperglycemia (HCC) Not currently on any medications for this.  He is trying to work on diet and exercise.  Will check A1c today.  May need to restart Ozempic depending on results.  Dyslipidemia associated with type 2 diabetes mellitus (HCC) On Lipitor 40 mg daily.  Check lipids.  Hypertension associated with diabetes ([place]) Mildly elevated today.  Home readings typically at range.  Continue Norvasc 5 mg daily.  He will continue to monitor at home.  He will let us know if persistently elevated.  Follow-up again at next office visit.  May need to increase dose of Norvasc if not controlled.  Preventative Healthcare: Check labs. Due for colon cancer screening later this year.  Shingles and Tdap given today.  Patient Counseling(The following topics were reviewed and/or handout was given):  -Nutrition: Stressed importance of moderation in sodium/caffeine intake, saturated fat and cholesterol, caloric balance, sufficient intake of fresh fruits, vegetables, and fiber.  -Stressed the importance of regular exercise.   -Substance Abuse: Discussed cessation/primary prevention of tobacco, alcohol, or other drug use; driving or other dangerous activities under the influence; availability of treatment for abuse.   -Injury prevention: Discussed safety belts, safety helmets, smoke detector, smoking near  bedding or upholstery.   -Sexuality: Discussed sexually transmitted diseases, partner selection, use of condoms, avoidance of unintended pregnancy and contraceptive alternatives.   -Dental health: Discussed importance of regular tooth brushing, flossing, and dental visits.  -Health maintenance and immunizations reviewed. Please refer to Health maintenance section.  Return to care in 1 year for next preventative visit.     Subjective:  HPI:  See A/p for status of chronic conditions.  Recently has noticed more urinary frequency.  No dysuria.  He is concerned about his blood sugar running high.  Lifestyle Diet: Balanced. Cutting down on carbs and sugars..  Exercise: Busy with work and around home.      03/22/2022    9:07 AM  Depression screen PHQ 2/9  Decreased Interest 0  Down, Depressed, Hopeless 0  PHQ - 2 Score 0    Health Maintenance Due  Topic Date Due   OPHTHALMOLOGY EXAM  Never done   Zoster Vaccines- Shingrix (1 of 2) Never done   HEMOGLOBIN A1C  10/25/2021   Diabetic kidney evaluation - eGFR measurement  10/26/2021   Diabetic kidney evaluation - Urine ACR  10/26/2021   DTaP/Tdap/Td (2 - Td or Tdap) 03/05/2022     ROS: Per HPI, otherwise a complete review of systems was negative.   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 No pertinent surgical history.  Family History  Problem Relation Age of Onset   Diabetes Mother    Lung cancer Father    Prostate cancer Neg Hx    Colon cancer Neg Hx      Medications- reviewed and updated Current Outpatient Medications  Medication Sig Dispense Refill   amLODipine (NORVASC) 5 MG tablet Take 1 tablet (5 mg total) by mouth daily. 90 tablet 3   atorvastatin (LIPITOR) 40 MG tablet Take 1 tablet (40 mg total) by mouth daily. 90 tablet 1   No current facility-administered medications for this visit.    Allergies-reviewed and updated No Known Allergies  Social History   Socioeconomic History   Marital status: Married    Spouse name: Not on file   Number of children: Not on file   Years of education: Not on file   Highest education level: Not on file  Occupational History   Not on file  Tobacco Use   Smoking status: Never    Passive exposure: Never   Smokeless tobacco: Never  Substance and Sexual Activity   Alcohol use: No   Drug use: No   Sexual activity: Not on file  Other Topics Concern   Not on file  Social History Narrative   Not on file   Social Determinants of Health   Financial Resource Strain: Not on file  Food Insecurity: Not on file  Transportation Needs: Not on file  Physical Activity: Not on file  Stress: Not on file  Social Connections: Not on file        Objective:  Physical Exam: BP (!) 152/89   Pulse 63   Temp 98.6 F (37 C) (Temporal)   Ht '5\' 9"'$  (1.753 m)   Wt 189 lb 12.8 oz (86.1 kg)   SpO2 99%   BMI 28.03 kg/m   Body mass index is 28.03 kg/m. Wt Readings from Last 3 Encounters:  03/22/22 189 lb 12.8 oz (86.1 kg)  07/20/21 197 lb 1.6 oz (89.4 kg)  04/25/21 210 lb (95.3 kg)   Gen: NAD, resting comfortably HEENT: TMs normal bilaterally. OP clear. No thyromegaly noted.  CV: RRR with no murmurs appreciated Pulm: NWOB, CTAB with no crackles, wheezes, or rhonchi GI: Normal bowel sounds present. Soft, Nontender, Nondistended. MSK: no edema, cyanosis, or clubbing noted Skin: warm, dry Neuro: CN2-12 grossly intact. Strength 5/5 in upper and lower extremities. Reflexes symmetric and intact  bilaterally.  Psych: Normal affect and thought content     Loney Peto M. Jerline Pain, MD 03/22/2022 10:17 AM

## 2022-03-22 NOTE — Patient Instructions (Signed)
It was very nice to see you today!  We will check blood work today.  Please keep an eye on your blood pressure and let us know if it is persistently elevated.  Please continue to work on diet and exercise.  Will probably see you back in a few months depending on results of your blood work.  Will see back in 1 year for your next physical.  Take care, Dr Jerline Pain  PLEASE NOTE:  If you had any lab tests, please let us know if you have not heard back within a few days. You may see your results on mychart before we have a chance to review them but we will give you a call once they are reviewed by [place].   If we ordered any referrals today, please let us know if you have not heard from their office within the next week.   If you had any urgent prescriptions sent in today, please check with the pharmacy within an hour of our visit to make sure the prescription was transmitted appropriately.   Please try these tips to maintain a healthy lifestyle:  Eat at least 3 REAL meals and 1-2 snacks per day.  Aim for no more than 5 hours between eating.  If you eat breakfast, please do so within one hour of getting up.   Each meal should contain half fruits/vegetables, one quarter protein, and one quarter carbs (no bigger than a computer mouse)  Cut down on sweet beverages. This includes juice, soda, and sweet tea.   Drink at least 1 glass of water with each meal and aim for at least 8 glasses per day  Exercise at least 150 minutes every week.    Preventive Care 56-80 Years Old, Male Preventive care refers to lifestyle choices and visits with your health care provider that can promote health and wellness. Preventive care visits are also called wellness exams. What can I expect for my preventive care visit? Counseling During your preventive care visit, your health care provider may ask about your: Medical history, including: Past medical problems. Family medical history. Current health,  including: Emotional well-being. Home life and relationship well-being. Sexual activity. Lifestyle, including: Alcohol, nicotine or tobacco, and drug use. Access to firearms. Diet, exercise, and sleep habits. Safety issues such as seatbelt and bike helmet use. Sunscreen use. Work and work Statistician. Physical exam Your health care provider will check your: Height and weight. These may be used to calculate your BMI (body mass index). BMI is a measurement that tells if you are at a healthy weight. Waist circumference. This measures the distance around your waistline. This measurement also tells if you are at a healthy weight and may help predict your risk of certain diseases, such as type 2 diabetes and high blood pressure. Heart rate and blood pressure. Body temperature. Skin for abnormal spots. What immunizations do I need?  Vaccines are usually given at various ages, according to a schedule. Your health care provider will recommend vaccines for you based on your age, medical history, and lifestyle or other factors, such as travel or where you work. What tests do I need? Screening Your health care provider may recommend screening tests for certain conditions. This may include: Lipid and cholesterol levels. Diabetes screening. This is done by checking your blood sugar (glucose) after you have not eaten for a while (fasting). Hepatitis B test. Hepatitis C test. HIV (human immunodeficiency virus) test. STI (sexually transmitted infection) testing, if you are at risk. Lung cancer  screening. Prostate cancer screening. Colorectal cancer screening. Talk with your health care provider about your test results, treatment options, and if necessary, the need for more tests. Follow these instructions at home: Eating and drinking  Eat a diet that includes fresh fruits and vegetables, whole grains, lean protein, and low-fat dairy products. Take vitamin and mineral supplements as recommended  by your health care provider. Do not drink alcohol if your health care provider tells you not to drink. If you drink alcohol: Limit how much you have to 0-2 drinks a day. Know how much alcohol is in your drink. In the [place] one drink equals one 12 oz bottle of beer (355 mL), one 5 oz glass of wine (148 mL), or one 1 oz glass of hard liquor (44 mL). Lifestyle Brush your teeth every morning and night with fluoride toothpaste. Floss one time each day. Exercise for at least 30 minutes 5 or more days each week. Do not use any products that contain nicotine or tobacco. These products include cigarettes, chewing tobacco, and vaping devices, such as e-cigarettes. If you need help quitting, ask your health care provider. Do not use drugs. If you are sexually active, practice safe sex. Use a condom or other form of protection to prevent STIs. Take aspirin only as told by your health care provider. Make sure that you understand how much to take and what form to take. Work with your health care provider to find out whether it is safe and beneficial for you to take aspirin daily. Find healthy ways to manage stress, such as: Meditation, yoga, or listening to music. Journaling. Talking to a trusted person. Spending time with friends and family. Minimize exposure to UV radiation to reduce your risk of skin cancer. Safety Always wear your seat belt while driving or riding in a vehicle. Do not drive: If you have been drinking alcohol. Do not ride with someone who has been drinking. When you are tired or distracted. While texting. If you have been using any mind-altering substances or drugs. Wear a helmet and other protective equipment during sports activities. If you have firearms in your house, make sure you follow all gun safety procedures. What's next? Go to your health care provider once a year for an annual wellness visit. Ask your health care provider how often you should have your eyes and teeth  checked. Stay up to date on all vaccines. This information is not intended to replace advice given to you by your health care provider. Make sure you discuss any questions you have with your health care provider. Document Revised: 06/23/2020 Document Reviewed: 06/23/2020 Elsevier Patient Education  [place].

## 2022-03-22 NOTE — Assessment & Plan Note (Signed)
Not currently on any medications for this.  He is trying to work on diet and exercise.  Will check A1c today.  May need to restart Ozempic depending on results.

## 2022-03-23 ENCOUNTER — Other Ambulatory Visit: Payer: Self-pay | Admitting: Family Medicine

## 2022-03-23 ENCOUNTER — Other Ambulatory Visit: Payer: Self-pay | Admitting: *Deleted

## 2022-03-23 LAB — URINE CULTURE
MICRO NUMBER:: 14687377
Result:: NO GROWTH
SPECIMEN QUALITY:: ADEQUATE

## 2022-03-23 MED ORDER — OZEMPIC (0.25 OR 0.5 MG/DOSE) 2 MG/1.5ML ~~LOC~~ SOPN: 0.2500 mg | PEN_INJECTOR | SUBCUTANEOUS | 1 refills | Status: DC

## 2022-03-23 NOTE — Progress Notes (Signed)
Please inform patient of the following:  His A1c is extremely elevated.  He has sugar in his urine as well. This is what is causing his frequent urination.  We need to have him back on diabetes medications.  We should restart Ozempic.  Start 0.25 mg weekly for 4 weeks and then increase to 0.5 mg weekly.  I would like for him to come back in the office in 3 months to recheck his A1c but would like for him to check in with [place] in a couple of weeks via MyChart if his symptoms are not improving.   The rest of his labs are all stable and we can recheck in a year.

## 2022-03-24 NOTE — Progress Notes (Signed)
Please inform patient of the following:  Urine culture negative. He has no infection.  Clinton Church. Jerline Pain, MD 03/24/2022 1:12 PM

## 2022-03-27 ENCOUNTER — Other Ambulatory Visit: Payer: Self-pay | Admitting: Family Medicine

## 2022-03-27 ENCOUNTER — Telehealth: Payer: Self-pay | Admitting: Family Medicine

## 2022-03-27 NOTE — Telephone Encounter (Signed)
PA Rx Ozempic approved, The authorization is valid from 02/25/2022 through 03/27/2023 Patient notified

## 2022-03-27 NOTE — Telephone Encounter (Signed)
Patient states: - Ozempic sent in on 3/14 needs a prior authorization  - Requests this process be started

## 2022-03-27 NOTE — Telephone Encounter (Signed)
Pharmacy notified.

## 2022-05-09 ENCOUNTER — Other Ambulatory Visit: Payer: Self-pay

## 2022-05-09 ENCOUNTER — Encounter: Payer: Self-pay | Admitting: Family Medicine

## 2022-05-09 MED ORDER — OZEMPIC (0.25 OR 0.5 MG/DOSE) 2 MG/1.5ML ~~LOC~~ SOPN: 0.2500 mg | PEN_INJECTOR | SUBCUTANEOUS | 1 refills | Status: DC

## 2022-05-09 MED ORDER — AMLODIPINE BESYLATE 5 MG PO TABS: 5.0000 mg | ORAL_TABLET | Freq: Every day | ORAL | 3 refills | Status: DC

## 2022-06-22 ENCOUNTER — Ambulatory Visit: Payer: Federal, State, Local not specified - PPO | Admitting: Family Medicine

## 2022-06-22 ENCOUNTER — Encounter: Payer: Self-pay | Admitting: Family Medicine

## 2022-06-22 VITALS — BP 142/90 | HR 71 | Temp 97.0°F | Ht 69.0 in | Wt 199.0 lb

## 2022-06-22 DIAGNOSIS — Z7985 Long-term (current) use of injectable non-insulin antidiabetic drugs: Secondary | ICD-10-CM | POA: Diagnosis not present

## 2022-06-22 DIAGNOSIS — E1165 Type 2 diabetes mellitus with hyperglycemia: Secondary | ICD-10-CM | POA: Diagnosis not present

## 2022-06-22 DIAGNOSIS — E1159 Type 2 diabetes mellitus with other circulatory complications: Secondary | ICD-10-CM

## 2022-06-22 DIAGNOSIS — I152 Hypertension secondary to endocrine disorders: Secondary | ICD-10-CM | POA: Diagnosis not present

## 2022-06-22 DIAGNOSIS — Z23 Encounter for immunization: Secondary | ICD-10-CM | POA: Diagnosis not present

## 2022-06-22 LAB — POCT GLYCOSYLATED HEMOGLOBIN (HGB A1C): Hemoglobin A1C: 7.1 % — AB (ref 4.0–5.6)

## 2022-06-22 NOTE — Progress Notes (Signed)
   Clinton Church is a 57 y.o. male who presents today for an office visit.  Assessment/Plan:  Chronic Problems Addressed Today: Type 2 diabetes mellitus with hyperglycemia (HCC) A1c much better controlled 7.1 on Ozempic 0.5 mg weekly.  He is working on lifestyle modifications.  Will continue Ozempic 0.5 mg weekly.  Recheck again in 3 to 6 months.  Congratulated patient on A1c reduction.  Hypertension associated with diabetes (HCC) Mildly elevated today.  He did forget to take his amlodipine yesterday.  Will continue amlodipine 5 mg daily.  He will monitor at home and let us know if persistently elevated.     Subjective:  HPI:  See Assessment / plan for status of chronic conditions. He is here today for followup. Was last seen 3 months ago for annual physical. At that time his A1c was 15.5. We restarted him back on Ozempic. He has done well with this. No significant side effects.        Objective:  Physical Exam: BP (!) 142/90   Pulse 71   Temp (!) 97 F (36.1 C) (Temporal)   Ht 5\' 9"  (1.753 m)   Wt 199 lb (90.3 kg)   SpO2 100%   BMI 29.39 kg/m   Wt Readings from Last 3 Encounters:  06/22/22 199 lb (90.3 kg)  03/22/22 189 lb 12.8 oz (86.1 kg)  07/20/21 197 lb 1.6 oz (89.4 kg)    Gen: No acute distress, resting comfortably CV: Regular rate and rhythm with no murmurs appreciated Pulm: Normal work of breathing, clear to auscultation bilaterally with no crackles, wheezes, or rhonchi Neuro: Grossly normal, moves all extremities Psych: Normal affect and thought content      Clinton Mergenthaler M. [name], MD 06/22/2022 9:53 AM

## 2022-06-22 NOTE — Patient Instructions (Signed)
It was very nice to see you today!  Your A1c looks much better today.  Please keep up the great work with your diet and exercise.  Will continue current medications.  Please continue to monitor your blood pressure and let us know if it is persistently elevated.  Return in about 6 months (around 12/22/2022) for Follow Up.   Take care, Dr [name]  PLEASE NOTE:  If you had any lab tests, please let us know if you have not heard back within a few days. You may see your results on mychart before we have a chance to review them but we will give you a call once they are reviewed by [place].   If we ordered any referrals today, please let us know if you have not heard from their office within the next week.   If you had any urgent prescriptions sent in today, please check with the pharmacy within an hour of our visit to make sure the prescription was transmitted appropriately.   Please try these tips to maintain a healthy lifestyle:  Eat at least 3 REAL meals and 1-2 snacks per day.  Aim for no more than 5 hours between eating.  If you eat breakfast, please do so within one hour of getting up.   Each meal should contain half fruits/vegetables, one quarter protein, and one quarter carbs (no bigger than a computer mouse)  Cut down on sweet beverages. This includes juice, soda, and sweet tea.   Drink at least 1 glass of water with each meal and aim for at least 8 glasses per day  Exercise at least 150 minutes every week.

## 2022-06-22 NOTE — Assessment & Plan Note (Signed)
A1c much better controlled 7.1 on Ozempic 0.5 mg weekly.  He is working on lifestyle modifications.  Will continue Ozempic 0.5 mg weekly.  Recheck again in 3 to 6 months.  Congratulated patient on A1c reduction.

## 2022-06-22 NOTE — Assessment & Plan Note (Signed)
Mildly elevated today.  He did forget to take his amlodipine yesterday.  Will continue amlodipine 5 mg daily.  He will monitor at home and let us know if persistently elevated.

## 2022-07-11 ENCOUNTER — Telehealth: Payer: Self-pay | Admitting: Family Medicine

## 2022-07-11 NOTE — Telephone Encounter (Signed)
Caller was Marquita Palms from [employer]. Marquita Palms states they received order for cologuard on 6/13 but the ICD-10 code associated with it is E11.65. Caller states they would need an ICD-10 code related to the reasoning for the cologuard test. They do have convenience codes such as Z12.11 and Z12.12.   Reference number: [id number] Marquita Palms can be reached back at [phone]

## 2022-07-12 ENCOUNTER — Other Ambulatory Visit: Payer: Self-pay | Admitting: *Deleted

## 2022-07-12 DIAGNOSIS — Z1211 Encounter for screening for malignant neoplasm of colon: Secondary | ICD-10-CM

## 2022-07-12 NOTE — Telephone Encounter (Signed)
Called [phone]  Cologurard was resend with new ICD-10 code  Sample box Will be send to patient within 7 days working day

## 2022-08-28 NOTE — Progress Notes (Signed)
Great news! Cologuard is negative. We can repeat in 3 years.  Clinton Church. [name], MD 08/28/2022 7:56 AM

## 2022-11-13 ENCOUNTER — Other Ambulatory Visit: Payer: Self-pay | Admitting: Family Medicine

## 2022-11-13 ENCOUNTER — Other Ambulatory Visit: Payer: Self-pay | Admitting: *Deleted

## 2022-11-13 MED ORDER — OZEMPIC (0.25 OR 0.5 MG/DOSE) 2 MG/1.5ML ~~LOC~~ SOPN: 0.2500 mg | PEN_INJECTOR | SUBCUTANEOUS | 1 refills | Status: DC

## 2022-12-22 ENCOUNTER — Ambulatory Visit: Payer: Federal, State, Local not specified - PPO | Admitting: Family Medicine

## 2022-12-22 VITALS — BP 161/98 | HR 83 | Temp 97.5°F | Ht 69.0 in | Wt 211.0 lb

## 2022-12-22 DIAGNOSIS — E1165 Type 2 diabetes mellitus with hyperglycemia: Secondary | ICD-10-CM

## 2022-12-22 DIAGNOSIS — Z23 Encounter for immunization: Secondary | ICD-10-CM

## 2022-12-22 DIAGNOSIS — Z7985 Long-term (current) use of injectable non-insulin antidiabetic drugs: Secondary | ICD-10-CM

## 2022-12-22 DIAGNOSIS — Z114 Encounter for screening for human immunodeficiency virus [HIV]: Secondary | ICD-10-CM | POA: Diagnosis not present

## 2022-12-22 DIAGNOSIS — I152 Hypertension secondary to endocrine disorders: Secondary | ICD-10-CM

## 2022-12-22 DIAGNOSIS — E1159 Type 2 diabetes mellitus with other circulatory complications: Secondary | ICD-10-CM

## 2022-12-22 DIAGNOSIS — M545 Low back pain, unspecified: Secondary | ICD-10-CM | POA: Insufficient documentation

## 2022-12-22 DIAGNOSIS — Z1159 Encounter for screening for other viral diseases: Secondary | ICD-10-CM

## 2022-12-22 LAB — POCT GLYCOSYLATED HEMOGLOBIN (HGB A1C): Hemoglobin A1C: 6.6 % — AB (ref 4.0–5.6)

## 2022-12-22 MED ORDER — AMLODIPINE BESYLATE 5 MG PO TABS
5.0000 mg | ORAL_TABLET | Freq: Every day | ORAL | 3 refills | Status: AC
Start: 2022-12-22 — End: ?

## 2022-12-22 MED ORDER — OZEMPIC (0.25 OR 0.5 MG/DOSE) 2 MG/3ML ~~LOC~~ SOPN: 0.5000 mg | PEN_INJECTOR | SUBCUTANEOUS | 0 refills | Status: AC

## 2022-12-22 NOTE — Assessment & Plan Note (Signed)
A1c very well-controlled at 6.6.  Congratulated patient on lifestyle changes and good glycemic control.  We will continue Ozempic 0.5 mg weekly.  Recheck in 6 months at CPE.

## 2022-12-22 NOTE — Patient Instructions (Signed)
It was very nice to see you today!  Your A1c looks much better today!  Your A1c is 6.6.  Please keep up the great work with your diet and exercise.  Please monitor your blood pressure at home for the next 2 weeks and send [place] a message in MyChart to let us know what is looking.  I will refer you to see sports medicine for your back.  Return in about 6 months (around 06/22/2023) for Annual Physical.   Take care, Dr [name]  PLEASE NOTE:  If you had any lab tests, please let us know if you have not heard back within a few days. You may see your results on mychart before we have a chance to review them but we will give you a call once they are reviewed by [place].   If we ordered any referrals today, please let us know if you have not heard from their office within the next week.   If you had any urgent prescriptions sent in today, please check with the pharmacy within an hour of our visit to make sure the prescription was transmitted appropriately.   Please try these tips to maintain a healthy lifestyle:  Eat at least 3 REAL meals and 1-2 snacks per day.  Aim for no more than 5 hours between eating.  If you eat breakfast, please do so within one hour of getting up.   Each meal should contain half fruits/vegetables, one quarter protein, and one quarter carbs (no bigger than a computer mouse)  Cut down on sweet beverages. This includes juice, soda, and sweet tea.   Drink at least 1 glass of water with each meal and aim for at least 8 glasses per day  Exercise at least 150 minutes every week.

## 2022-12-22 NOTE — Progress Notes (Signed)
   Clinton Church is a 57 y.o. male who presents today for an office visit.  Assessment/Plan:  Chronic Problems Addressed Today: Type 2 diabetes mellitus with hyperglycemia (HCC) A1c very well-controlled at 6.6.  Congratulated patient on lifestyle changes and good glycemic control.  We will continue Ozempic 0.5 mg weekly.  Recheck in 6 months at CPE.  Hypertension associated with diabetes (HCC) Elevated today.  His home readings are typically in the 130s to 140s over 80s per patient.  May have some element of whitecoat hypertension.  We did discuss importance of good blood pressure control.  He has been compliant with his amlodipine 5 mg daily.  He will monitor at home for the next few weeks and check in with [place] on MyChart.  If he does have persistent elevations at home we will increase amlodipine to 10 mg daily.  Low back pain Not currently having any pain and overall reassuring exam today.  Likely does have some degenerative changes in the lower back however given persistence of symptoms over the last couple of years, it would be reasonable for [place] to pursue further workup at this point.  Will refer to sports medicine.  He likely does need imaging however we will defer this to sports medicine.  He will let us know if he has any change in symptoms before he can see them.     Subjective:  HPI:  See A/P for status of chronic conditions.  Patient is here today for follow-up.  We last saw him 6 months ago.  At that time A1c was well-controlled at 7.1 on Ozempic 0.5 mg weekly.   He has had some intermittent issues with low back pain. This has been going on for the last 2-3 years. 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 Usually this gets aggravated with lifting and certain motions. Recently he has started to notice a sharp pain in his lower back with twisting motions. Sometimes pain goes into left leg.  Not currently having any pain.        Objective:  Physical Exam: BP (!) 161/98    Pulse 83   Temp (!) 97.5 F (36.4 C) (Temporal)   Ht 5\' 9"  (1.753 m)   Wt 211 lb (95.7 kg)   SpO2 96%   BMI 31.16 kg/m   Wt Readings from Last 3 Encounters:  12/22/22 211 lb (95.7 kg)  06/22/22 199 lb (90.3 kg)  03/22/22 189 lb 12.8 oz (86.1 kg)  Gen: No acute distress, resting comfortably CV: Regular rate and rhythm with no murmurs appreciated Pulm: Normal work of breathing, clear to auscultation bilaterally with no crackles, wheezes, or rhonchi MUSCULOSKELETAL: Back without deformities.  Nontender to palpation.  Neurovascular intact distally. Neuro: Grossly normal, moves all extremities Psych: Normal affect and thought content      Arnett Galindez M. [name], MD 12/22/2022 9:54 AM

## 2022-12-22 NOTE — Assessment & Plan Note (Signed)
Not currently having any pain and overall reassuring exam today.  Likely does have some degenerative changes in the lower back however given persistence of symptoms over the last couple of years, it would be reasonable for [place] to pursue further workup at this point.  Will refer to sports medicine.  He likely does need imaging however we will defer this to sports medicine.  He will let us know if he has any change in symptoms before he can see them.

## 2022-12-22 NOTE — Assessment & Plan Note (Signed)
Elevated today.  His home readings are typically in the 130s to 140s over 80s per patient.  May have some element of whitecoat hypertension.  We did discuss importance of good blood pressure control.  He has been compliant with his amlodipine 5 mg daily.  He will monitor at home for the next few weeks and check in with [place] on MyChart.  If he does have persistent elevations at home we will increase amlodipine to 10 mg daily.

## 2023-01-08 NOTE — Progress Notes (Signed)
 Ben Aneesa Romey D.[facility] [address] [place] [address] Phone: [phone]   Assessment and Plan:     1. Chronic bilateral low back pain with left-sided sciatica 2. Degeneration of intervertebral disc of lumbar region with discogenic back pain and lower extremity pain -Chronic with exacerbation, initial sports medicine visit - Most consistent with recurrent flare of lumbar DDD causing sharp low back pain with occasional left-sided radicular symptoms - X-ray obtained in clinic.  My interpretation: No acute fracture or vertebral collapse.  DDD most pronounced between L 4-5 and L5-S1 - Start meloxicam  15 mg daily x2 weeks.  If still having pain after 2 weeks, complete 3rd-week of NSAID. May use remaining NSAID as needed once daily for pain control.  Do not to use additional over-the-counter NSAIDs (ibuprofen, naproxen, Advil, Aleve) while taking prescription NSAIDs.  May use Tylenol [phone] mg 2 to 3 times a day for breakthrough pain. -Start HEP and physical therapy   Pertinent previous records reviewed include none  Follow Up: 4 weeks for reevaluation.  If no improvement or worsening of symptoms, could consider lumbar MRI versus discussing OMT   Subjective:   I, Clinton Church, am serving as a neurosurgeon for Doctor Morene Mace  Chief Complaint: low back pain   HPI:   01/15/2023 Patient is a 57 year old male with low back pain. Patient states has had pain for a couple of years. He was picking up a concrete slab and when he turned with it he felt a pop in his lower back. After a few weeks the pain resolved, but now he has been in flare after picking up a post . He now has lift sided pain that radiates down his leg. When he is sitting and flexes forward he feels a shift in his spine . He does use heat and that seems to help a little. No meds for the pain. When he is at rest he has no pain only when he flexes forward  Relevant Historical  Information: DM type II, hypertension  Additional pertinent review of systems negative.   Current Outpatient Medications:    amLODipine  (NORVASC ) 5 MG tablet, Take 1 tablet (5 mg total) by mouth daily., Disp: 90 tablet, Rfl: 3   atorvastatin  (LIPITOR) 40 MG tablet, Take 1 tablet (40 mg total) by mouth daily., Disp: 90 tablet, Rfl: 1   meloxicam  (MOBIC ) 15 MG tablet, Take 1 tablet (15 mg total) by mouth daily., Disp: 30 tablet, Rfl: 0   Semaglutide ,0.25 or 0.5MG /DOS, (OZEMPIC , 0.25 OR 0.5 MG/DOSE,) 2 MG/3ML SOPN, Inject 0.5 mg into the skin once a week., Disp: 9 mL, Rfl: 0   Objective:     Vitals:   01/15/23 1511  BP: 126/82  Pulse: 78  SpO2: 96%  Weight: 211 lb (95.7 kg)  Height: 5' 9 (1.753 m)      Body mass index is 31.16 kg/m.    Physical Exam:    Gen: Appears well, nad, nontoxic and pleasant Psych: Alert and oriented, appropriate mood and affect Neuro: sensation intact, strength is 5/5 in upper and lower extremities, muscle tone wnl Skin: no susupicious lesions or rashes  Back - Normal skin, Spine with normal alignment and no deformity.   No tenderness to vertebral process palpation.   Paraspinous muscles are not tender and without spasm NTTP gluteal musculature Straight leg raise negative Trendelenberg negative Piriformis Test negative Gait normal  Central low back pain with lumbar extension and flexion  Electronically  signed by:  Odis Mace D[facility] 3:41 PM 01/15/23

## 2023-01-15 ENCOUNTER — Ambulatory Visit: Payer: Federal, State, Local not specified - PPO | Admitting: Sports Medicine

## 2023-01-15 ENCOUNTER — Ambulatory Visit (INDEPENDENT_AMBULATORY_CARE_PROVIDER_SITE_OTHER): Payer: Federal, State, Local not specified - PPO

## 2023-01-15 VITALS — BP 126/82 | HR 78 | Ht 69.0 in | Wt 211.0 lb

## 2023-01-15 DIAGNOSIS — G8929 Other chronic pain: Secondary | ICD-10-CM

## 2023-01-15 DIAGNOSIS — M5442 Lumbago with sciatica, left side: Secondary | ICD-10-CM | POA: Diagnosis not present

## 2023-01-15 DIAGNOSIS — M51362 Other intervertebral disc degeneration, lumbar region with discogenic back pain and lower extremity pain: Secondary | ICD-10-CM | POA: Diagnosis not present

## 2023-01-15 DIAGNOSIS — M545 Low back pain, unspecified: Secondary | ICD-10-CM

## 2023-01-15 MED ORDER — MELOXICAM 15 MG PO TABS: 15.0000 mg | ORAL_TABLET | Freq: Every day | ORAL | 0 refills | Status: AC

## 2023-01-15 NOTE — Patient Instructions (Signed)
-   Start meloxicam 15 mg daily x2 weeks.  If still having pain after 2 weeks, complete 3rd-week of NSAID. May use remaining NSAID as needed once daily for pain control.  Do not to use additional over-the-counter NSAIDs (ibuprofen, naproxen, Advil, Aleve) while taking prescription NSAIDs.  May use Tylenol [phone] mg 2 to 3 times a day for breakthrough pain. Low back HEP  PT referral  4 week follow up

## 2023-02-09 NOTE — Progress Notes (Deleted)
    Aleen Sells D.[facility] [address] [place] [address] Phone: [phone]   Assessment and Plan:     There are no diagnoses linked to this encounter.  ***   Pertinent previous records reviewed include ***    Follow Up: ***     Subjective:   I, Clinton Church, am serving as a Neurosurgeon for Doctor Richardean Sale   Chief Complaint: low back pain    HPI:    01/15/2023 Patient is a 58 year old male with low back pain. Patient states has had pain for a couple of years. He was picking up a concrete slab and when he turned with it he felt a pop in his lower back. After a few weeks the pain resolved, but now he has been in flare after picking up a post . He now has lift sided pain that radiates down his leg. When he is sitting and flexes forward he feels a shift in his spine . He does use heat and that seems to help a little. No meds for the pain. When he is at rest he has no pain only when he flexes forward  02/12/2023 Patient states   Relevant Historical Information: DM type II, hypertension    Additional pertinent review of systems negative.   Current Outpatient Medications:    amLODipine (NORVASC) 5 MG tablet, Take 1 tablet (5 mg total) by mouth daily., Disp: 90 tablet, Rfl: 3   atorvastatin (LIPITOR) 40 MG tablet, Take 1 tablet (40 mg total) by mouth daily., Disp: 90 tablet, Rfl: 1   meloxicam (MOBIC) 15 MG tablet, Take 1 tablet (15 mg total) by mouth daily., Disp: 30 tablet, Rfl: 0   Semaglutide,0.25 or 0.5MG /DOS, (OZEMPIC, 0.25 OR 0.5 MG/DOSE,) 2 MG/3ML SOPN, Inject 0.5 mg into the skin once a week., Disp: 9 mL, Rfl: 0   Objective:     There were no vitals filed for this visit.    There is no height or weight on file to calculate BMI.    Physical Exam:    ***   Electronically signed by:  Aleen Sells [facility] 7:31 AM 02/09/23

## 2023-02-12 ENCOUNTER — Ambulatory Visit: Payer: Federal, State, Local not specified - PPO | Admitting: Sports Medicine

## 2023-03-20 ENCOUNTER — Other Ambulatory Visit

## 2023-03-23 ENCOUNTER — Emergency Department (HOSPITAL_BASED_OUTPATIENT_CLINIC_OR_DEPARTMENT_OTHER): Admission: EM | Admit: 2023-03-23 | Discharge: 2023-03-23 | Disposition: A | Discharge status: Home / Self Care

## 2023-03-23 ENCOUNTER — Emergency Department (HOSPITAL_BASED_OUTPATIENT_CLINIC_OR_DEPARTMENT_OTHER)

## 2023-03-23 ENCOUNTER — Encounter (HOSPITAL_BASED_OUTPATIENT_CLINIC_OR_DEPARTMENT_OTHER): Payer: Self-pay | Admitting: Emergency Medicine

## 2023-03-23 ENCOUNTER — Other Ambulatory Visit: Payer: Self-pay

## 2023-03-23 DIAGNOSIS — R339 Retention of urine, unspecified: Secondary | ICD-10-CM | POA: Insufficient documentation

## 2023-03-23 DIAGNOSIS — R1032 Left lower quadrant pain: Secondary | ICD-10-CM | POA: Insufficient documentation

## 2023-03-23 DIAGNOSIS — R338 Other retention of urine: Secondary | ICD-10-CM

## 2023-03-23 DIAGNOSIS — Z79899 Other long term (current) drug therapy: Secondary | ICD-10-CM | POA: Insufficient documentation

## 2023-03-23 DIAGNOSIS — I10 Essential (primary) hypertension: Secondary | ICD-10-CM | POA: Insufficient documentation

## 2023-03-23 LAB — CBC
HCT: 48.4 % (ref 39.0–52.0)
Hemoglobin: 16.5 g/dL (ref 13.0–17.0)
MCH: 28.6 pg (ref 26.0–34.0)
MCHC: 34.1 g/dL (ref 30.0–36.0)
MCV: 83.9 fL (ref 80.0–100.0)
Platelets: 205 10*3/uL (ref 150–400)
RBC: 5.77 MIL/uL (ref 4.22–5.81)
RDW: 13 % (ref 11.5–15.5)
WBC: 8.5 10*3/uL (ref 4.0–10.5)
nRBC: 0 % (ref 0.0–0.2)

## 2023-03-23 LAB — LIPASE, BLOOD: Lipase: 42 U/L (ref 11–51)

## 2023-03-23 LAB — COMPREHENSIVE METABOLIC PANEL
ALT: 13 U/L (ref 0–44)
AST: 15 U/L (ref 15–41)
Albumin: 4.9 g/dL (ref 3.5–5.0)
Alkaline Phosphatase: 77 U/L (ref 38–126)
Anion gap: 11 (ref 5–15)
BUN: 19 mg/dL (ref 6–20)
CO2: 25 mmol/L (ref 22–32)
Calcium: 9.3 mg/dL (ref 8.9–10.3)
Chloride: 98 mmol/L (ref 98–111)
Creatinine, Ser: 1.18 mg/dL (ref 0.61–1.24)
GFR, Estimated: >60 mL/min (ref >60)
Glucose, Bld: 270 mg/dL — ABNORMAL HIGH (ref 70–99)
Potassium: 3.7 mmol/L (ref 3.5–5.1)
Sodium: 134 mmol/L — ABNORMAL LOW (ref 135–145)
Total Bilirubin: 0.8 mg/dL (ref 0.0–1.2)
Total Protein: 7.6 g/dL (ref 6.5–8.1)

## 2023-03-23 LAB — URINALYSIS, ROUTINE W REFLEX MICROSCOPIC
Bacteria, UA: NONE SEEN
Bilirubin Urine: NEGATIVE
Glucose, UA: >1000 mg/dL — AB
Hgb urine dipstick: NEGATIVE
Ketones, ur: 40 mg/dL — AB
Leukocytes,Ua: NEGATIVE
Nitrite: NEGATIVE
Protein, ur: 30 mg/dL — AB
Specific Gravity, Urine: 1.035 — ABNORMAL HIGH (ref 1.005–1.030)
pH: 6.5 (ref 5.0–8.0)

## 2023-03-23 MED ORDER — IOHEXOL 350 MG/ML SOLN
100.0000 mL | Freq: Once | INTRAVENOUS | Status: AC | PRN
  Administered 2023-03-23: 100 mL via INTRAVENOUS

## 2023-03-23 MED ORDER — ONDANSETRON HCL 4 MG/2ML IJ SOLN
4.0000 mg | Freq: Once | INTRAMUSCULAR | Status: AC
  Administered 2023-03-23: 4 mg via INTRAVENOUS
  Filled 2023-03-23: qty 2

## 2023-03-23 MED ORDER — HYDROMORPHONE HCL 1 MG/ML IJ SOLN
1.0000 mg | Freq: Once | INTRAMUSCULAR | Status: AC
  Administered 2023-03-23: 1 mg via INTRAVENOUS
  Filled 2023-03-23: qty 1

## 2023-03-23 NOTE — ED Notes (Signed)
 Teaching on use of foley bag given.

## 2023-03-23 NOTE — Discharge Instructions (Signed)
 Your workup was reassuring.  It does appear that you are having urinary retention.  Please keep the catheter in until you are able to follow-up with the urologist.  Please follow-up with your primary care doctor to have your blood pressure rechecked as an outpatient as it was elevated here today.  Return to the ER for worsening symptoms.

## 2023-03-23 NOTE — ED Triage Notes (Signed)
 started Tuesday with abdo pain into back  Some constipation but had a BM today after OTC intervention

## 2023-03-23 NOTE — ED Provider Notes (Signed)
 [facility] AT [facility] Provider Note   CSN: [id number] Arrival date & time: 03/23/23  1600     History  Chief Complaint  Patient presents with   Constipation   Abdominal Pain    Clinton Church is a 58 y.o. male.  58 year old male with past medical history of hypertension hyperlipidemia presenting to the emergency department today with left-sided abdominal pain.  The patient states has been going now for the past few days.  Ports he did not had a bowel movement till today.  He is taken multiple laxatives over-the-counter and eventually did have a small bowel movement today after taking Dulcolax positive wearing.  The patient states he is still having pain in his left lower abdomen and flank.  He has had some nausea but denies any vomiting.  Denies any associated fevers.  He denies a history of urinary symptoms.  Denies a history of kidney stones.  He came to the ER today further evaluation regards.   Constipation Associated symptoms: abdominal pain   Abdominal Pain Associated symptoms: constipation        Home Medications Prior to Admission medications   Medication Sig Start Date End Date Taking? Authorizing Provider  amLODipine (NORVASC) 5 MG tablet Take 1 tablet (5 mg total) by mouth daily. 12/22/22   Ardith Dark, MD  atorvastatin (LIPITOR) 40 MG tablet Take 1 tablet (40 mg total) by mouth daily. 09/02/21   Ardith Dark, MD  meloxicam (MOBIC) 15 MG tablet Take 1 tablet (15 mg total) by mouth daily. 01/15/23   Richardean Sale, DO  Semaglutide,0.25 or 0.5MG /DOS, (OZEMPIC, 0.25 OR 0.5 MG/DOSE,) 2 MG/3ML SOPN Inject 0.5 mg into the skin once a week. 12/22/22   Ardith Dark, MD      Allergies    Patient has no known allergies.    Review of Systems   Review of Systems  Gastrointestinal:  Positive for abdominal pain and constipation.  All other systems reviewed and are negative.   Physical Exam Updated Vital Signs BP (!) 180/130  (BP Location: Right Arm)   Pulse (!) 102   Temp 98.1 F (36.7 C) (Oral)   Resp 20   SpO2 100%  Physical Exam Vitals and nursing note reviewed.   Gen: NAD Eyes: PERRL, EOMI HEENT: no oropharyngeal swelling Neck: trachea midline Resp: clear to auscultation bilaterally Card: RRR, no murmurs, rubs, or gallops Abd: The patient is tender over the left lower quadrant with some left-sided CVA tenderness noted Extremities: no calf tenderness, no edema Vascular: 2+ radial pulses bilaterally, 2+ DP pulses bilaterally Skin: no rashes Psyc: acting appropriately   ED Results / Procedures / Treatments   Labs (all labs ordered are listed, but only abnormal results are displayed) Labs Reviewed  COMPREHENSIVE METABOLIC PANEL - Abnormal; Notable for the following components:      Result Value   Sodium 134 (*)    Glucose, Bld 270 (*)    All other components within normal limits  URINALYSIS, ROUTINE W REFLEX MICROSCOPIC - Abnormal; Notable for the following components:   Specific Gravity, Urine 1.035 (*)    Glucose, UA >1,000 (*)    Ketones, ur 40 (*)    Protein, ur 30 (*)    All other components within normal limits  LIPASE, BLOOD  CBC    EKG None  Radiology CT Angio Abd/Pel W and/or Wo Contrast Result Date: 03/23/2023 CLINICAL DATA:  Left flank pain, abdominal pain, elevated blood pressure EXAM: CTA ABDOMEN  AND PELVIS WITHOUT AND WITH CONTRAST TECHNIQUE: Multidetector CT imaging of the abdomen and pelvis was performed using the standard protocol during bolus administration of intravenous contrast. Multiplanar reconstructed images and MIPs were obtained and reviewed to evaluate the vascular anatomy. RADIATION DOSE REDUCTION: This exam was performed according to the departmental dose-optimization program which includes automated exposure control, adjustment of the mA and/or kV according to patient size and/or use of iterative reconstruction technique. CONTRAST:  OMNIPAQUE IOHEXOL 350  MG/ML SOLN COMPARISON:  None Available. FINDINGS: VASCULAR Aorta: Normal caliber aorta without aneurysm, dissection, vasculitis or significant stenosis. Minimal atherosclerosis. Celiac: Patent without evidence of aneurysm, dissection, vasculitis or significant stenosis. SMA: Patent without evidence of aneurysm, dissection, vasculitis or significant stenosis. Renals: Both renal arteries are patent without evidence of aneurysm, dissection, vasculitis, fibromuscular dysplasia or significant stenosis. IMA: Patent. Inflow: Patent without evidence of aneurysm, dissection, vasculitis or significant stenosis. Proximal Outflow: Bilateral common femoral and visualized portions of the superficial and profunda femoral arteries are patent without evidence of aneurysm, dissection, vasculitis or significant stenosis. Veins: No obvious venous abnormality within the limitations of this arterial phase study. Review of the MIP images confirms the above findings. NON-VASCULAR Lower chest: Included lung bases are clear.  Heart size is normal. Hepatobiliary: No focal liver abnormality is seen. No gallstones, gallbladder wall thickening, or biliary dilatation. Pancreas: Unremarkable. No pancreatic ductal dilatation or surrounding inflammatory changes. Spleen: Normal in size without focal abnormality. Adrenals/Urinary Tract: Unremarkable adrenal glands. Kidneys enhance symmetrically. There are 2 nonobstructing stones identified within the right kidney, the larger of which measures 5 mm in size. No left-sided renal calculi are seen. No hydronephrosis. No ureteral calculi. Markedly distended urinary bladder, nearly to the level of the umbilicus. Stomach/Bowel: Stomach is within normal limits. Appendix appears normal (series 4, image 139). No evidence of bowel wall thickening, distention, or inflammatory changes. Lymphatic: No abdominopelvic lymphadenopathy. Reproductive: Prostatomegaly with prominent mass effect at the base of the urinary  bladder. Other: No free fluid. No abdominopelvic fluid collection. No pneumoperitoneum. No abdominal wall hernia. Musculoskeletal: No acute or significant osseous findings. Degenerative changes of both hips. IMPRESSION: 1. No acute vascular abnormality within the abdomen or pelvis. 2. Markedly distended urinary bladder, nearly to the level of the umbilicus. Correlate for urinary retention. 3. Prostatomegaly with prominent mass effect at the base of the urinary bladder. 4. Nonobstructing right nephrolithiasis. Electronically Signed   By: Duanne Guess D.O.   On: 03/23/2023 18:20    Procedures Procedures    Medications Ordered in ED Medications  HYDROmorphone (DILAUDID) injection 1 mg (1 mg Intravenous Given 03/23/23 1652)  ondansetron (ZOFRAN) injection 4 mg (4 mg Intravenous Given 03/23/23 1651)  iohexol (OMNIPAQUE) 350 MG/ML injection 100 mL (100 mLs Intravenous Contrast Given 03/23/23 1700)    ED Course/ Medical Decision Making/ A&P                                 Medical Decision Making 58 year old male with past medical history of hypertension and hyperlipidemia presenting to the emergency department today with left-sided flank and abdominal pain.  I will further evaluate patient here with basic labs including LFTs and a lipase to evaluate for hepatobiliary pathology or pancreatitis.  Also obtain a CT scan to evaluate for ureterolithiasis, diverticulitis, colitis, bowel obstruction, or other intra-abdominal pathology.  The patient's blood pressure is significantly elevated here.  Will obtain a CT angiogram to evaluate for aortic pathology  although suspicion is relatively low given the fact that the patient has been having the symptoms now for the past few days.  I will give him Dilaudid and Zofran for his symptoms.  Blood pressure may be elevated due to pain.  I will reevaluate for ultimate disposition.  The patient's workup here is reassuring overall.  His CT scan did show findings  concerning for urinary retention.  A Foley catheter was placed and the patient did appear to be in retention.  He was feeling much better on reassessment.  His vital signs have improved and his heart rate is within normal limits.  Blood pressures also come down.  I think patient is stable for discharge.  He will be given urology follow-up.  Amount and/or Complexity of Data Reviewed Labs: ordered. Radiology: ordered.  Risk Prescription drug management.           Final Clinical Impression(s) / ED Diagnoses Final diagnoses:  Acute urinary retention    Rx / DC Orders ED Discharge Orders     None         Durwin Glaze, MD 03/23/23 Serena Croissant

## 2023-03-25 ENCOUNTER — Encounter (HOSPITAL_BASED_OUTPATIENT_CLINIC_OR_DEPARTMENT_OTHER): Payer: Self-pay

## 2023-03-25 ENCOUNTER — Emergency Department (HOSPITAL_BASED_OUTPATIENT_CLINIC_OR_DEPARTMENT_OTHER)
Admission: EM | Admit: 2023-03-25 | Discharge: 2023-03-25 | Disposition: A | Discharge status: Home / Self Care | Attending: Emergency Medicine | Admitting: Emergency Medicine

## 2023-03-25 DIAGNOSIS — Z794 Long term (current) use of insulin: Secondary | ICD-10-CM | POA: Diagnosis not present

## 2023-03-25 DIAGNOSIS — Z466 Encounter for fitting and adjustment of urinary device: Secondary | ICD-10-CM | POA: Insufficient documentation

## 2023-03-25 DIAGNOSIS — I10 Essential (primary) hypertension: Secondary | ICD-10-CM | POA: Insufficient documentation

## 2023-03-25 DIAGNOSIS — Z79899 Other long term (current) drug therapy: Secondary | ICD-10-CM | POA: Insufficient documentation

## 2023-03-25 DIAGNOSIS — E119 Type 2 diabetes mellitus without complications: Secondary | ICD-10-CM | POA: Insufficient documentation

## 2023-03-25 LAB — URINALYSIS, ROUTINE W REFLEX MICROSCOPIC
Bacteria, UA: NONE SEEN
Bilirubin Urine: NEGATIVE
Glucose, UA: 500 mg/dL — AB
Ketones, ur: 15 mg/dL — AB
Nitrite: NEGATIVE
Protein, ur: NEGATIVE mg/dL
Specific Gravity, Urine: <1.005 — ABNORMAL LOW (ref 1.005–1.030)
pH: 5.5 (ref 5.0–8.0)

## 2023-03-25 NOTE — ED Notes (Signed)
 He was able to void. His urine is light yellow and clear in appearance.

## 2023-03-25 NOTE — ED Provider Notes (Signed)
 [facility] AT [facility] Provider Note   CSN: [id number] Arrival date & time: 03/25/23  5284     History  No chief complaint on file.   Clinton Church is a 58 y.o. male.  HPI    58 year old male comes in with chief complaint of Foley related discomfort.  On 3-14 patient was seen in the ER for abdominal pain and found to have marked bladder distention, had a urinary Foley catheter placed.  He is supposed to call urologist on Monday.  He states that he has not been able to sleep ever since then.  He indicates that the Foley catheter is draining properly without any issues right now.  He has known history of enlarged prostate.  He denies previous history of requiring Foley catheter.  He suspects that his symptoms were a result of increased caffeine and carbonated beverage intake.  He has discontinued that over the last 2 days, and wants [place] to remove the Foley catheter to see if he can void on his own and become more comfortable.  He is supposed to call urologist on Monday.  Currently patient has no pain, fevers, chills and no blood in the urine.  Home Medications Prior to Admission medications   Medication Sig Start Date End Date Taking? Authorizing Provider  amLODipine (NORVASC) 5 MG tablet Take 1 tablet (5 mg total) by mouth daily. 12/22/22   Ardith Dark, MD  atorvastatin (LIPITOR) 40 MG tablet Take 1 tablet (40 mg total) by mouth daily. 09/02/21   Ardith Dark, MD  meloxicam (MOBIC) 15 MG tablet Take 1 tablet (15 mg total) by mouth daily. 01/15/23   Richardean Sale, DO  Semaglutide,0.25 or 0.5MG /DOS, (OZEMPIC, 0.25 OR 0.5 MG/DOSE,) 2 MG/3ML SOPN Inject 0.5 mg into the skin once a week. 12/22/22   Ardith Dark, MD      Allergies    Patient has no known allergies.    Review of Systems   Review of Systems  All other systems reviewed and are negative.   Physical Exam Updated Vital Signs BP (!) 178/116 (BP Location: Left Arm)   Pulse 85    Temp 98.4 F (36.9 C) (Oral)   Resp 16   Ht 5\' 9"  (1.753 m)   Wt 89.9 kg   SpO2 100%   BMI 29.27 kg/m  Physical Exam Vitals and nursing note reviewed.  Constitutional:      Appearance: He is well-developed.  HENT:     Head: Atraumatic.  Cardiovascular:     Rate and Rhythm: Normal rate.  Pulmonary:     Effort: Pulmonary effort is normal.  Musculoskeletal:     Cervical back: Neck supple.  Skin:    General: Skin is warm.  Neurological:     Mental Status: He is alert and oriented to person, place, and time.     ED Results / Procedures / Treatments   Labs (all labs ordered are listed, but only abnormal results are displayed) Labs Reviewed  URINALYSIS, ROUTINE W REFLEX MICROSCOPIC - Abnormal; Notable for the following components:      Result Value   Color, Urine COLORLESS (*)    Specific Gravity, Urine <1.005 (*)    Glucose, UA 500 (*)    Hgb urine dipstick LARGE (*)    Ketones, ur 15 (*)    Leukocytes,Ua SMALL (*)    All other components within normal limits    EKG None  Radiology CT Angio Abd/Pel W and/or Wo Contrast  Result Date: 03/23/2023 CLINICAL DATA:  Left flank pain, abdominal pain, elevated blood pressure EXAM: CTA ABDOMEN AND PELVIS WITHOUT AND WITH CONTRAST TECHNIQUE: Multidetector CT imaging of the abdomen and pelvis was performed using the standard protocol during bolus administration of intravenous contrast. Multiplanar reconstructed images and MIPs were obtained and reviewed to evaluate the vascular anatomy. RADIATION DOSE REDUCTION: This exam was performed according to the departmental dose-optimization program which includes automated exposure control, adjustment of the mA and/or kV according to patient size and/or use of iterative reconstruction technique. CONTRAST:  OMNIPAQUE IOHEXOL 350 MG/ML SOLN COMPARISON:  None Available. FINDINGS: VASCULAR Aorta: Normal caliber aorta without aneurysm, dissection, vasculitis or significant stenosis. Minimal  atherosclerosis. Celiac: Patent without evidence of aneurysm, dissection, vasculitis or significant stenosis. SMA: Patent without evidence of aneurysm, dissection, vasculitis or significant stenosis. Renals: Both renal arteries are patent without evidence of aneurysm, dissection, vasculitis, fibromuscular dysplasia or significant stenosis. IMA: Patent. Inflow: Patent without evidence of aneurysm, dissection, vasculitis or significant stenosis. Proximal Outflow: Bilateral common femoral and visualized portions of the superficial and profunda femoral arteries are patent without evidence of aneurysm, dissection, vasculitis or significant stenosis. Veins: No obvious venous abnormality within the limitations of this arterial phase study. Review of the MIP images confirms the above findings. NON-VASCULAR Lower chest: Included lung bases are clear.  Heart size is normal. Hepatobiliary: No focal liver abnormality is seen. No gallstones, gallbladder wall thickening, or biliary dilatation. Pancreas: Unremarkable. No pancreatic ductal dilatation or surrounding inflammatory changes. Spleen: Normal in size without focal abnormality. Adrenals/Urinary Tract: Unremarkable adrenal glands. Kidneys enhance symmetrically. There are 2 nonobstructing stones identified within the right kidney, the larger of which measures 5 mm in size. No left-sided renal calculi are seen. No hydronephrosis. No ureteral calculi. Markedly distended urinary bladder, nearly to the level of the umbilicus. Stomach/Bowel: Stomach is within normal limits. Appendix appears normal (series 4, image 139). No evidence of bowel wall thickening, distention, or inflammatory changes. Lymphatic: No abdominopelvic lymphadenopathy. Reproductive: Prostatomegaly with prominent mass effect at the base of the urinary bladder. Other: No free fluid. No abdominopelvic fluid collection. No pneumoperitoneum. No abdominal wall hernia. Musculoskeletal: No acute or significant osseous  findings. Degenerative changes of both hips. IMPRESSION: 1. No acute vascular abnormality within the abdomen or pelvis. 2. Markedly distended urinary bladder, nearly to the level of the umbilicus. Correlate for urinary retention. 3. Prostatomegaly with prominent mass effect at the base of the urinary bladder. 4. Nonobstructing right nephrolithiasis. Electronically Signed   By: Duanne Guess D.O.   On: 03/23/2023 18:20    Procedures Procedures    Medications Ordered in ED Medications - No data to display  ED Course/ Medical Decision Making/ A&P Clinical Course as of 03/25/23 0937  Sun Mar 25, 2023  0937 Pt able to void. Stable for d/c. [AN]    Clinical Course User Index [AN] Derwood Kaplan, MD                                 Medical Decision Making Patient with history of diabetes, hypertension, hyperlipidemia, possible BPH comes in with chief complaint of Foley catheter discomfort.  On 3-14 Foley catheter was placed because of significant urinary retention.  CT scan had shown distended bladder.  Patient states that he has not been able to sleep well and now the Foley catheter is becoming more of an adversarial part than helpful.  He wants [place] to remove  the Foley catheter.  He is planning to call the urologist on Monday to get an appointment.  I reviewed patient's recent ER visit, his medications and also his CT scan from previous encounter.  I have reviewed patient's medication.  He is on meloxicam, that could be contributing.  Will advise that he did use NSAIDs.  I discussed with him that we prefer and recommend that the Foley catheter be removed by the urologist, as they can troubleshoot the issue more in the clinic and provide additional guidance.  Some people will have increased discomfort with the Foley catheter placement, but over time to get used to it.  Patient still prefers that the Foley catheter is removed.  He understands that after removing the Foley catheter, he might  not be able to void given possibility of BPH, in which case he might need reintroduction of the Foley catheter.  I have ordered discontinuation of Foley at the request of the patient, after he understood the risks of that step.  We will order a UA, to see if he can void.  Amount and/or Complexity of Data Reviewed Labs: ordered.    Final Clinical Impression(s) / ED Diagnoses Final diagnoses:  Encounter for Foley catheter removal    Rx / DC Orders ED Discharge Orders     None         Derwood Kaplan, MD 03/25/23 [phone]

## 2023-03-25 NOTE — ED Notes (Signed)
 Our tech. Trinna Post has removed his foley catheter without incident. Pt. Tells [place] he feels "a lot better". We are providing drinks to facilitate obtaining a clean catch urine.

## 2023-03-25 NOTE — Discharge Instructions (Addendum)
 If you start having difficulty with urinating, abdominal distention and pain return to the ER.

## 2023-03-25 NOTE — ED Notes (Signed)
 Patient blood pressure is elevated in triage. States he took his BP medication today around 3am.

## 2023-03-25 NOTE — ED Triage Notes (Signed)
 Patient states he had a catheter placed on Friday. Patient states he hasn't slept because he is having to empty the bag frequently. States everything he drinks goes straight through him. Patient would rather had the catheter removed and see his urologist on Monday.

## 2023-03-30 ENCOUNTER — Telehealth: Payer: Self-pay

## 2023-03-30 ENCOUNTER — Other Ambulatory Visit (HOSPITAL_COMMUNITY): Payer: Self-pay

## 2023-03-30 NOTE — Telephone Encounter (Signed)
 Pharmacy Patient Advocate Encounter   Received notification from Onbase that prior authorization for Ozempic (0.25 or 0.5 MG/DOSE) 2MG /3ML pen-injectors is required/requested.   Insurance verification completed.   The patient is insured through [employer] [facility] .   Per test claim: PA required; PA started via CoverMyMeds. KEY [id number] . Waiting for clinical questions to populate.

## 2023-04-05 NOTE — Telephone Encounter (Signed)
 Pharmacy Patient Advocate Encounter  Received notification from [employer] that Prior Authorization for Ozempic (0.25 or 0.5 MG/DOSE) 2MG /3ML pen-injectors has been APPROVED from 03/06/2023 to 04/04/2024

## 2023-06-25 ENCOUNTER — Encounter: Payer: Federal, State, Local not specified - PPO | Admitting: Family Medicine

## 2024-01-21 ENCOUNTER — Other Ambulatory Visit: Payer: Self-pay | Admitting: Family Medicine

## 2024-01-21 DIAGNOSIS — E1159 Type 2 diabetes mellitus with other circulatory complications: Secondary | ICD-10-CM

## 2024-02-11 ENCOUNTER — Encounter: Admitting: Family Medicine

## 2024-02-15 ENCOUNTER — Telehealth: Payer: Self-pay | Admitting: *Deleted

## 2024-02-15 NOTE — Telephone Encounter (Signed)
 Copied from CRM [id number]. Topic: Clinical - Medication Refill >> Feb 15, 2024  2:44 PM Tiffini S wrote: Medication: amLODipine  (NORVASC ) 5 MG tablet, Semaglutide ,0.25 or 0.5MG /DOS, (OZEMPIC , 0.25 OR 0.5 MG/DOSE,) 2 MG/3ML SOPN  Has the patient contacted their pharmacy? Yes (Agent: If no, request that the patient contact the pharmacy for the refill. If patient does not wish to contact the pharmacy document the reason why and proceed with request.) (Agent: If yes, when and what did the pharmacy advise?)  This is the patient's preferred pharmacy:  [id number] [place] [place] - [address] [place] [address] Phone: [phone] Fax: [phone]  Is this the correct pharmacy for this prescription? Yes If no, delete pharmacy and type the correct one.   Has the prescription been filled recently? Yes  Is the patient out of the medication? Yes  Has the patient been seen for an appointment in the last year OR does the patient have an upcoming appointment? Yes  Can we respond through MyChart? Yes  Agent: Please be advised that Rx refills may take up to 3 business days. We ask that you follow-up with your pharmacy.    Patient need OV for medication refills Last visit 12/22/2022 [facility]

## 2024-02-18 ENCOUNTER — Ambulatory Visit: Admitting: Family Medicine

## 2024-04-04 ENCOUNTER — Encounter: Admitting: Family Medicine
